# Patient Record
Sex: Male | Born: 1980
Health system: Southern US, Community
[De-identification: ages and names within clinical notes are randomized; demographics above are authoritative.]

## PROBLEM LIST (undated history)

## (undated) DIAGNOSIS — I10 Essential (primary) hypertension: Secondary | ICD-10-CM

## (undated) DIAGNOSIS — E7800 Pure hypercholesterolemia, unspecified: Secondary | ICD-10-CM

## (undated) HISTORY — DX: Essential (primary) hypertension: I10

---

## 2012-02-29 DIAGNOSIS — I10 Essential (primary) hypertension: Secondary | ICD-10-CM

## 2012-02-29 HISTORY — DX: Essential (primary) hypertension: I10

## 2013-12-11 ENCOUNTER — Ambulatory Visit: Payer: Self-pay | Admitting: Family Medicine

## 2014-01-10 ENCOUNTER — Encounter: Payer: Self-pay | Admitting: Family Medicine

## 2014-01-10 ENCOUNTER — Ambulatory Visit: Payer: Self-pay | Admitting: Family Medicine

## 2015-08-04 ENCOUNTER — Emergency Department (HOSPITAL_COMMUNITY)
Admission: EM | Admit: 2015-08-04 | Discharge: 2015-08-04 | Disposition: A | Discharge status: Home / Self Care | Payer: Self-pay | Attending: Emergency Medicine | Admitting: Emergency Medicine

## 2015-08-04 ENCOUNTER — Encounter (HOSPITAL_COMMUNITY): Payer: Self-pay

## 2015-08-04 DIAGNOSIS — L299 Pruritus, unspecified: Secondary | ICD-10-CM | POA: Insufficient documentation

## 2015-08-04 DIAGNOSIS — I10 Essential (primary) hypertension: Secondary | ICD-10-CM | POA: Insufficient documentation

## 2015-08-04 DIAGNOSIS — F17210 Nicotine dependence, cigarettes, uncomplicated: Secondary | ICD-10-CM | POA: Insufficient documentation

## 2015-08-04 MED ORDER — PREDNISONE 10 MG (21) PO TBPK: 10.0000 mg | ORAL_TABLET | Freq: Every day | ORAL | Status: DC

## 2015-08-04 MED ORDER — LORATADINE 10 MG PO TABS: 10.0000 mg | ORAL_TABLET | Freq: Every day | ORAL | Status: DC

## 2015-08-04 MED ORDER — HYDROXYZINE HCL 25 MG PO TABS: 25.0000 mg | ORAL_TABLET | Freq: Four times a day (QID) | ORAL | Status: DC

## 2015-08-04 NOTE — ED Notes (Signed)
Patient complains of intermittent itching and a small hive that pops up on extremities and groin that resolves without intervention, denies pain. No distress

## 2015-08-04 NOTE — Discharge Instructions (Signed)
You have been seen today for itching, possibly from allergies. Prednisone is a steroid and is used to reduce inflammation and swelling. Take it as directed in its entirety. Hydroxyzine is an anti-itch medication. Claritin is an antihistamine or allergy medication that should be taken daily. Follow up with PCP as needed should symptoms continue. Return to ED should symptoms worsen or you begin to have fever, vomiting, abdominal pain, difficulty urinating, or any other major concerns.

## 2015-08-04 NOTE — ED Notes (Addendum)
ERROR IN CHARTING 

## 2015-08-04 NOTE — ED Provider Notes (Signed)
CSN: [id number]     Arrival date & time 08/04/15  5621 History  By signing my name below, I, Essence Howell and Alyssa Grove, attest that this documentation has been prepared under the direction and in the presence of Shawn Joy, PA-C.  Electronically Signed: Charline Bills, ED Scribe 08/04/2015 at 2:37 PM.    Chief Complaint  Patient presents with  . Pruritis     The history is provided by the patient. No language interpreter was used.   HPI Comments: Thomas Mclean. is a 35 y.o. male who presents to the Emergency Department complaining of intermittent, allergic reaction of hives onset of last week on the left thigh, right posterior, and genital area. Pt reports having allergies to grass and dust. He reports having similar symptoms throughout his life. Pt has not used anything to relieve rash. Pt denies exposure to wood vine or poison ivy, new soaps/detergents, or any other new substance. Patient denies fevers/chills, abdominal pain, back pain, nausea/vomiting, difficulty urinating, or any other complaints.   Past Medical History  Diagnosis Date  . Hypertension 2014   History reviewed. No pertinent past surgical history. Family History  Problem Relation Age of Onset  . Cancer Father   . Hypertension Maternal Grandmother   . Hypertension Maternal Grandfather    Social History  Substance Use Topics  . Smoking status: Current Every Day Smoker    Types: Cigarettes    Start date: 02/28/1993  . Smokeless tobacco: Never Used  . Alcohol Use: No    Review of Systems  Constitutional: Negative for fever and chills.  Gastrointestinal: Negative for nausea, vomiting and abdominal pain.  Genitourinary: Negative for dysuria, hematuria, flank pain and penile pain.  Musculoskeletal: Negative for back pain.  Skin: Positive for rash.  All other systems reviewed and are negative.   Allergies  Review of patient's allergies indicates no known allergies.  Home Medications   Prior to  Admission medications   Medication Sig Start Date End Date Taking? Authorizing Provider  hydrOXYzine (ATARAX/VISTARIL) 25 MG tablet Take 1 tablet (25 mg total) by mouth every 6 (six) hours. 08/04/15   Shawn C Joy, PA-C  loratadine (CLARITIN) 10 MG tablet Take 1 tablet (10 mg total) by mouth daily. 08/04/15   Shawn C Joy, PA-C  predniSONE (STERAPRED UNI-PAK 21 TAB) 10 MG (21) TBPK tablet Take 1 tablet (10 mg total) by mouth daily. Take 6 tabs by mouth daily  for 2 days, then 5 tabs for 2 days, then 4 tabs for 2 days, then 3 tabs for 2 days, 2 tabs for 2 days, then 1 tab by mouth daily for 2 days 08/04/15   Shawn C Joy, PA-C   BP 140/84 mmHg  Pulse 68  Temp(Src) 98.3 F (36.8 C)  Resp 18  SpO2 100% Physical Exam  Constitutional: He appears well-developed and well-nourished. No distress.  HENT:  Head: Normocephalic and atraumatic.  Eyes: Conjunctivae are normal.  Neck: Neck supple.  Cardiovascular: Normal rate, regular rhythm and intact distal pulses.   Pulmonary/Chest: Effort normal. No respiratory distress.  Abdominal: Soft. There is no tenderness. There is no guarding.  Genitourinary:  Edema in the foreskin of penis and skin of the scrotum. No discernable lesions, tenderness, or penile discharge. Scribe, Daphine Deutscher, served as Biomedical engineer during the exam.  Musculoskeletal: Normal range of motion. He exhibits no edema or tenderness.  Neurological: He is alert.  Skin: Skin is warm and dry. He is not diaphoretic.  No discernible lesions in the  areas patient indicates for his pruritus.  Psychiatric: He has a normal mood and affect. His behavior is normal.  Nursing note and vitals reviewed.   ED Course  Procedures (including critical care time) DIAGNOSTIC STUDIES: Oxygen Saturation is 100% on RA, normal by my interpretation.    COORDINATION OF CARE: 11:28 AM-Discussed treatment plan which includes Prednisone, Hydroxyzine, and Ioratadine with pt at bedside and pt agreed to plan.     MDM    Final diagnoses:  Pruritus    Thomas HornKenneth Ray [facility] presents with itching in multiple areas of his body, intermittent over the last week.  Patient has known objective symptoms other than some edema into the skin of the penis and scrotum. This, combined with the complaint of itching, could be from allergic reaction or some other source. Patient given antihistamines and prednisone. Patient to follow up with PCP should symptoms continue. Return precautions discussed. Patient voiced understanding of these instructions and is comfortable with discharge.  I personally performed the services described in this documentation, which was scribed in my presence. The recorded information has been reviewed and is accurate.  Anselm PancoastShawn C Joy, PA-C 08/04/15 1437  Glynn OctaveStephen Rancour, MD 08/04/15 [phone]

## 2016-12-09 ENCOUNTER — Ambulatory Visit (INDEPENDENT_AMBULATORY_CARE_PROVIDER_SITE_OTHER): Payer: Self-pay | Admitting: Physician Assistant

## 2016-12-09 ENCOUNTER — Encounter (INDEPENDENT_AMBULATORY_CARE_PROVIDER_SITE_OTHER): Payer: Self-pay | Admitting: Physician Assistant

## 2016-12-09 ENCOUNTER — Other Ambulatory Visit (HOSPITAL_COMMUNITY)
Admission: RE | Admit: 2016-12-09 | Discharge: 2016-12-09 | Disposition: A | Discharge status: Home / Self Care | Payer: Self-pay | Source: Ambulatory Visit | Attending: Physician Assistant | Admitting: Physician Assistant

## 2016-12-09 VITALS — BP 157/90 | HR 66 | Temp 98.2°F | Wt 198.4 lb

## 2016-12-09 DIAGNOSIS — J069 Acute upper respiratory infection, unspecified: Secondary | ICD-10-CM

## 2016-12-09 DIAGNOSIS — I10 Essential (primary) hypertension: Secondary | ICD-10-CM

## 2016-12-09 DIAGNOSIS — Z202 Contact with and (suspected) exposure to infections with a predominantly sexual mode of transmission: Secondary | ICD-10-CM

## 2016-12-09 DIAGNOSIS — Z131 Encounter for screening for diabetes mellitus: Secondary | ICD-10-CM

## 2016-12-09 DIAGNOSIS — R7303 Prediabetes: Secondary | ICD-10-CM

## 2016-12-09 LAB — POCT GLYCOSYLATED HEMOGLOBIN (HGB A1C): HEMOGLOBIN A1C: 5.7

## 2016-12-09 MED ORDER — METRONIDAZOLE 500 MG PO TABS: 500.0000 mg | ORAL_TABLET | Freq: Two times a day (BID) | ORAL | 0 refills | Status: AC

## 2016-12-09 MED ORDER — GUAIFENESIN ER 1200 MG PO TB12: 1.0000 | ORAL_TABLET | Freq: Two times a day (BID) | ORAL | 0 refills | Status: AC

## 2016-12-09 MED ORDER — HYDROCHLOROTHIAZIDE 25 MG PO TABS: 25.0000 mg | ORAL_TABLET | Freq: Every day | ORAL | 1 refills | Status: DC

## 2016-12-09 NOTE — Progress Notes (Signed)
Subjective:  Patient ID: Thomas Likes., male    DOB: Jul 06, 1980  Age: 36 y.o. MRN: [id number]  CC: URI, STD?  HPI Thomas Rawlinson Hatillo. is a 36 y.o. male with a medical history of HTN presents as a new patient with concern for URI and STD. Says girlfriend was diagnosed with trichomonas. Does not endorse penile discharge, genital lesions, testicular pain, testicular swelling, dysuria, urinary frequency, or hematuria.    Also complains of cold and nasal congestion. Onset approximately 1-2 weeks ago. No longer with aches, fever, chills, or fatigue. However, there is still mucus drainage. Does not currently take any cold remedies.     Outpatient Medications Prior to Visit  Medication Sig Dispense Refill  . hydrOXYzine (ATARAX/VISTARIL) 25 MG tablet Take 1 tablet (25 mg total) by mouth every 6 (six) hours. (Patient not taking: Reported on 12/09/2016) 12 tablet 0  . loratadine (CLARITIN) 10 MG tablet Take 1 tablet (10 mg total) by mouth daily. (Patient not taking: Reported on 12/09/2016) 30 tablet 2  . predniSONE (STERAPRED UNI-PAK 21 TAB) 10 MG (21) TBPK tablet Take 1 tablet (10 mg total) by mouth daily. Take 6 tabs by mouth daily  for 2 days, then 5 tabs for 2 days, then 4 tabs for 2 days, then 3 tabs for 2 days, 2 tabs for 2 days, then 1 tab by mouth daily for 2 days (Patient not taking: Reported on 12/09/2016) 42 tablet 0   No facility-administered medications prior to visit.      ROS Review of Systems  Constitutional: Negative for chills, fever and malaise/fatigue.  HENT: Positive for congestion.   Eyes: Negative for blurred vision.  Respiratory: Negative for shortness of breath.   Cardiovascular: Negative for chest pain and palpitations.  Gastrointestinal: Negative for abdominal pain and nausea.  Genitourinary: Negative for dysuria and hematuria.  Musculoskeletal: Negative for joint pain and myalgias.  Skin: Negative for rash.  Neurological: Negative for tingling and  headaches.  Psychiatric/Behavioral: Negative for depression. The patient is not nervous/anxious.     Objective:  Wt 198 lb 6.4 oz (90 kg)   BP/Weight 12/09/2016 08/04/2015  Systolic BP - 140  Diastolic BP - 84  Wt. (Lbs) 198.4 -      Physical Exam  Constitutional: He is oriented to person, place, and time.  Well developed, well nourished, NAD, polite  HENT:  Head: Normocephalic and atraumatic.  Normal oropharynx  Eyes: No scleral icterus.  Neck: Normal range of motion. Neck supple. No thyromegaly present.  Cardiovascular: Normal rate, regular rhythm and normal heart sounds.   Pulmonary/Chest: Effort normal and breath sounds normal.  Musculoskeletal: He exhibits no edema.  Neurological: He is alert and oriented to person, place, and time. No cranial nerve deficit. Coordination normal.  Skin: Skin is warm and dry. No rash noted. No erythema. No pallor.  Psychiatric: He has a normal mood and affect. His behavior is normal. Thought content normal.  Vitals reviewed.    Assessment & Plan:   1. Hypertension, unspecified type - Begin HCTZ  qday #90 one refill  2. STD exposure - HIV antibody - Urine cytology ancillary only - HSV(herpes simplex vrs) 1+2 ab-IgG - RPR - metroNIDAZOLE (FLAGYL) 500 MG tablet; Take 1 tablet (500 mg total) by mouth 2 (two) times daily.  Dispense: 14 tablet; Refill: 0  3. Acute upper respiratory infection - Guaifenesin (MUCINEX MAXIMUM STRENGTH) 1200 MG TB12; Take 1 tablet (1,200 mg total) by mouth 2 (two) times daily.  Dispense:  10 tablet; Refill: 0  4. Screening for diabetes mellitus - HgB A1c 5.7% in clinic today  5. Prediabetes - A1c 5.7% in clinic today - Printed prediabetes eating plan   Meds ordered this encounter  Medications  . metroNIDAZOLE (FLAGYL) 500 MG tablet    Sig: Take 1 tablet (500 mg total) by mouth 2 (two) times daily.    Dispense:  14 tablet    Refill:  0    Order Specific Question:   Supervising Provider     Answer:   Quentin Angst [id number]  . Guaifenesin (MUCINEX MAXIMUM STRENGTH) 1200 MG TB12    Sig: Take 1 tablet (1,200 mg total) by mouth 2 (two) times daily.    Dispense:  10 tablet    Refill:  0    Order Specific Question:   Supervising Provider    Answer:   Quentin Angst [id number]    Follow-up: Return in about 6 months (around 06/09/2017) for prediabetes.   Loletta Specter PA

## 2016-12-09 NOTE — Patient Instructions (Addendum)
Trichomoniasis Trichomoniasis is an STI (sexually transmitted infection) that can affect both women and men. In women, the outer area of the male genitalia (vulva) and the vagina are affected. In men, the penis is mainly affected, but the prostate and other reproductive organs can also be involved. This condition can be treated with medicine. It often has no symptoms (is asymptomatic), especially in men. What are the causes? This condition is caused by an organism called Trichomonas vaginalis. Trichomoniasis most often spreads from person to person (is contagious) through sexual contact. What increases the risk? The following factors may make you more likely to develop this condition:  Having unprotected sexual intercourse.  Having sexual intercourse with a partner who has trichomoniasis.  Having multiple sexual partners.  Having had previous trichomoniasis infections or other STIs.  What are the signs or symptoms? In women, symptoms of trichomoniasis include:  Abnormal vaginal discharge that is clear, white, gray, or yellow-green and foamy and has an unusual "fishy" odor.  Itching and irritation of the vagina and vulva.  Burning or pain during urination or sexual intercourse.  Genital redness and swelling.  In men, symptoms of trichomoniasis include:  Penile discharge that may be foamy or contain pus.  Pain in the penis. This may happen only when urinating.  Itching or irritation inside the penis.  Burning after urination or ejaculation.  How is this diagnosed? In women, this condition may be found during a routine Pap test or physical exam. It may be found in men during a routine physical exam. Your health care provider may perform tests to help diagnose this infection, such as:  Urine tests (men and women).  The following in women: ? Testing the pH of the vagina. ? A vaginal swab test that checks for the Trichomonas vaginalis organism. ? Testing vaginal  secretions.  Your health care provider may test you for other STIs, including HIV (human immunodeficiency virus). How is this treated? This condition is treated with medicine taken by mouth (orally), such as metronidazole or tinidazole to fight the infection. Your sexual partner(s) may also need to be tested and treated.  If you are a woman and you plan to become pregnant or think you may be pregnant, tell your health care provider right away. Some medicines that are used to treat the infection should not be taken during pregnancy.  Your health care provider may recommend over-the-counter medicines or creams to help relieve itching or irritation. You may be tested for infection again 3 months after treatment. Follow these instructions at home:  Take and use over-the-counter and prescription medicines, including creams, only as told by your health care provider.  Do not have sexual intercourse until one week after you finish your medicine, or until your health care provider approves. Ask your health care provider when you may resume sexual intercourse.  (Women) Do not douche or wear tampons while you have the infection.  Discuss your infection with your sexual partner(s). Make sure that your partner gets tested and treated, if necessary.  Keep all follow-up visits as told by your health care provider. This is important. How is this prevented?  Use condoms every time you have sex. Using condoms correctly and consistently can help protect against STIs.  Avoid having multiple sexual partners.  Talk with your sexual partner about any symptoms that either of you may have, as well as any history of STIs.  Get tested for STIs and STDs (sexually transmitted diseases) before you have sex. Ask your partner  to do the same.  Do not have sexual contact if you have symptoms of trichomoniasis or another STI. Contact a health care provider if:  You still have symptoms after you finish your  medicine.  You develop pain in your abdomen.  You have pain when you urinate.  You have bleeding after sexual intercourse.  You develop a rash.  You feel nauseous or you vomit.  You plan to become pregnant or think you may be pregnant. Summary  Trichomoniasis is an STI (sexually transmitted infection) that can affect both women and men.  This condition often has no symptoms (is asymptomatic), especially in men.  You should not have sexual intercourse until one week after you finish your medicine, or until your health care provider approves. Ask your health care provider when you may resume sexual intercourse.  Discuss your infection with your sexual partner. Make sure that your partner gets tested and treated, if necessary. This information is not intended to replace advice given to you by your health care provider. Make sure you discuss any questions you have with your health care provider. Document Released: 08/10/2000 Document Revised: 01/08/2016 Document Reviewed: 01/08/2016 Elsevier Interactive Patient Education  2017 [employer] Upper Respiratory Infection, Adult Most upper respiratory infections (URIs) are a viral infection of the air passages leading to the lungs. A URI affects the nose, throat, and upper air passages. The most common type of URI is nasopharyngitis and is typically referred to as "the common cold." URIs run their course and usually go away on their own. Most of the time, a URI does not require medical attention, but sometimes a bacterial infection in the upper airways can follow a viral infection. This is called a secondary infection. Sinus and middle ear infections are common types of secondary upper respiratory infections. Bacterial pneumonia can also complicate a URI. A URI can worsen asthma and chronic obstructive pulmonary disease (COPD). Sometimes, these complications can require emergency medical care and may be life threatening. What are the  causes? Almost all URIs are caused by viruses. A virus is a type of germ and can spread from one person to another. What increases the risk? You may be at risk for a URI if:  You smoke.  You have chronic heart or lung disease.  You have a weakened defense (immune) system.  You are very young or very old.  You have nasal allergies or asthma.  You work in crowded or poorly ventilated areas.  You work in health care facilities or schools.  What are the signs or symptoms? Symptoms typically develop 2-3 days after you come in contact with a cold virus. Most viral URIs last 7-10 days. However, viral URIs from the influenza virus (flu virus) can last 14-18 days and are typically more severe. Symptoms may include:  Runny or stuffy (congested) nose.  Sneezing.  Cough.  Sore throat.  Headache.  Fatigue.  Fever.  Loss of appetite.  Pain in your forehead, behind your eyes, and over your cheekbones (sinus pain).  Muscle aches.  How is this diagnosed? Your health care provider may diagnose a URI by:  Physical exam.  Tests to check that your symptoms are not due to another condition such as: ? Strep throat. ? Sinusitis. ? Pneumonia. ? Asthma.  How is this treated? A URI goes away on its own with time. It cannot be cured with medicines, but medicines may be prescribed or recommended to relieve symptoms. Medicines may help:  Reduce your fever.  Reduce your cough.  Relieve nasal congestion.  Follow these instructions at home:  Take medicines only as directed by your health care provider.  Gargle warm saltwater or take cough drops to comfort your throat as directed by your health care provider.  Use a warm mist humidifier or inhale steam from a shower to increase air moisture. This may make it easier to breathe.  Drink enough fluid to keep your urine clear or pale yellow.  Eat soups and other clear broths and maintain good nutrition.  Rest as needed.  Return  to work when your temperature has returned to normal or as your health care provider advises. You may need to stay home longer to avoid infecting others. You can also use a face mask and careful hand washing to prevent spread of the virus.  Increase the usage of your inhaler if you have asthma.  Do not use any tobacco products, including cigarettes, chewing tobacco, or electronic cigarettes. If you need help quitting, ask your health care provider. How is this prevented? The best way to protect yourself from getting a cold is to practice good hygiene.  Avoid oral or hand contact with people with cold symptoms.  Wash your hands often if contact occurs.  There is no clear evidence that vitamin C, vitamin E, echinacea, or exercise reduces the chance of developing a cold. However, it is always recommended to get plenty of rest, exercise, and practice good nutrition. Contact a health care provider if:  You are getting worse rather than better.  Your symptoms are not controlled by medicine.  You have chills.  You have worsening shortness of breath.  You have brown or red mucus.  You have yellow or brown nasal discharge.  You have pain in your face, especially when you bend forward.  You have a fever.  You have swollen neck glands.  You have pain while swallowing.  You have white areas in the back of your throat. Get help right away if:  You have severe or persistent: ? Headache. ? Ear pain. ? Sinus pain. ? Chest pain.  You have chronic lung disease and any of the following: ? Wheezing. ? Prolonged cough. ? Coughing up blood. ? A change in your usual mucus.  You have a stiff neck.  You have changes in your: ? Vision. ? Hearing. ? Thinking. ? Mood. This information is not intended to replace advice given to you by your health care provider. Make sure you discuss any questions you have with your health care provider. Document Released: 08/10/2000 Document Revised:  10/18/2015 Document Reviewed: 05/22/2013 Elsevier Interactive Patient Education  2017 Else[employer] Inc[employer]    Prediabetes Eating Plan Prediabetes-also called impaired glucose tolerance or impaired fasting glucose-is a condition that causes blood sugar (blood glucose) levels to be higher than normal. Following a healthy diet can help to keep prediabetes under control. It can also help to lower the risk of type 2 diabetes and heart disease, which are increased in people who have prediabetes. Along with regular exercise, a healthy diet:  Promotes weight loss.  Helps to control blood sugar levels.  Helps to improve the way that the body uses insulin.  What do I need to know about this eating plan?  Use the glycemic index (GI) to plan your meals. The index tells you how quickly a food will raise your blood sugar. Choose low-GI foods. These foods take a longer time to raise blood sugar.  Pay close attention to the amount of  carbohydrates in the food that you eat. Carbohydrates increase blood sugar levels.  Keep track of how many calories you take in. Eating the right amount of calories will help you to achieve a healthy weight. Losing about 7 percent of your starting weight can help to prevent type 2 diabetes.  You may want to follow a Mediterranean diet. This diet includes a lot of vegetables, lean meats or fish, whole grains, fruits, and healthy oils and fats. What foods can I eat? Grains Whole grains, such as whole-wheat or whole-grain breads, crackers, cereals, and pasta. Unsweetened oatmeal. Bulgur. Barley. Quinoa. Brown rice. Corn or whole-wheat flour tortillas or taco shells. Vegetables Lettuce. Spinach. Peas. Beets. Cauliflower. Cabbage. Broccoli. Carrots. Tomatoes. Squash. Eggplant. Herbs. Peppers. Onions. Cucumbers. Brussels sprouts. Fruits Berries. Bananas. Apples. Oranges. Grapes. Papaya. Mango. Pomegranate. Kiwi. Grapefruit. Cherries. Meats and Other Protein Sources Seafood. Lean  meats, such as chicken and [place] or lean cuts of pork and beef. Tofu. Eggs. Nuts. Beans. Dairy Low-fat or fat-free dairy products, such as yogurt, cottage cheese, and cheese. Beverages Water. Tea. Coffee. Sugar-free or diet soda. Seltzer water. Milk. Milk alternatives, such as soy or almond milk. Condiments Mustard. Relish. Low-fat, low-sugar ketchup. Low-fat, low-sugar barbecue sauce. Low-fat or fat-free mayonnaise. Sweets and Desserts Sugar-free or low-fat pudding. Sugar-free or low-fat ice cream and other frozen treats. Fats and Oils Avocado. Walnuts. Olive oil. The items listed above may not be a complete list of recommended foods or beverages. Contact your dietitian for more options. What foods are not recommended? Grains Refined white flour and flour products, such as bread, pasta, snack foods, and cereals. Beverages Sweetened drinks, such as sweet iced tea and soda. Sweets and Desserts Baked goods, such as cake, cupcakes, pastries, cookies, and cheesecake. The items listed above may not be a complete list of foods and beverages to avoid. Contact your dietitian for more information. This information is not intended to replace advice given to you by your health care provider. Make sure you discuss any questions you have with your health care provider. Document Released: 07/01/2014 Document Revised: 07/23/2015 Document Reviewed: 03/12/2014 Elsevier Interactive Patient Education  2017 [employer].

## 2016-12-12 LAB — URINE CYTOLOGY ANCILLARY ONLY
Chlamydia: NEGATIVE
Neisseria Gonorrhea: NEGATIVE
Trichomonas: NEGATIVE

## 2016-12-12 LAB — SYPHILIS: RPR W/REFLEX TO RPR TITER AND TREPONEMAL ANTIBODIES, TRADITIONAL SCREENING AND DIAGNOSIS ALGORITHM: RPR Ser Ql: NONREACTIVE

## 2016-12-12 LAB — HSV(HERPES SIMPLEX VRS) I + II AB-IGG
HSV 1 Glycoprotein G Ab, IgG: 7.88 index — ABNORMAL HIGH (ref 0.00–0.90)
HSV 2 IgG, Type Spec: <0.91 index (ref 0.00–0.90)

## 2016-12-12 LAB — HIV ANTIBODY (ROUTINE TESTING W REFLEX): HIV Screen 4th Generation wRfx: NONREACTIVE

## 2016-12-13 ENCOUNTER — Telehealth: Payer: Self-pay

## 2016-12-13 NOTE — Telephone Encounter (Signed)
Patient aware of lab results. Tempestt S Roberts, CMA  

## 2016-12-13 NOTE — Telephone Encounter (Signed)
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 Negative for HSV 2 (genital usually), syphillis, chlamydia, gonorrhea, HIV, and trichomonas.

## 2017-04-13 ENCOUNTER — Encounter (INDEPENDENT_AMBULATORY_CARE_PROVIDER_SITE_OTHER): Payer: Self-pay | Admitting: Physician Assistant

## 2017-04-13 ENCOUNTER — Ambulatory Visit (INDEPENDENT_AMBULATORY_CARE_PROVIDER_SITE_OTHER): Payer: Self-pay | Admitting: Physician Assistant

## 2017-04-13 ENCOUNTER — Telehealth (INDEPENDENT_AMBULATORY_CARE_PROVIDER_SITE_OTHER): Payer: Self-pay | Admitting: Physician Assistant

## 2017-04-13 VITALS — BP 165/103 | HR 78 | Temp 98.2°F | Resp 18 | Ht 72.0 in | Wt 186.0 lb

## 2017-04-13 DIAGNOSIS — I10 Essential (primary) hypertension: Secondary | ICD-10-CM

## 2017-04-13 MED ORDER — ASPIRIN 81 MG PO TBEC: 81.0000 mg | DELAYED_RELEASE_TABLET | Freq: Every day | ORAL | 3 refills | Status: DC

## 2017-04-13 MED ORDER — HYDROCHLOROTHIAZIDE 25 MG PO TABS: 25.0000 mg | ORAL_TABLET | Freq: Every day | ORAL | 1 refills | Status: DC

## 2017-04-13 MED ORDER — AMLODIPINE BESYLATE 10 MG PO TABS
10.0000 mg | ORAL_TABLET | Freq: Every day | ORAL | 1 refills | Status: DC
Start: 2017-04-13 — End: 2017-05-10

## 2017-04-13 NOTE — Progress Notes (Signed)
Subjective:  Patient ID: Thomas LikesKenneth Ray Calles Jr., male    DOB: 03/28/1980  Age: 37 y.o. MRN: [id number]  CC: HTN  HPI   Thomas LikesKenneth Ray Kratt Jr. is a 37 y.o. male with a medical history of HTN and prediabetes  presents on f/u for HTN. Last BP 157/90 mmHg on 12/09/16. Prescribed HCTZ 25 mg which he takes daily. Did not take this morning. BP is now 165/103. Has taken BP at outside BP monitors and reports 200s/100s.  Has rare substernal pain that last 2 minutes and characterized as sharp. Occasional nausea with vomiting. Not related to food/liquid consumption. Rare SOB. Occasional headache. No radiation of chest pain. Does not endorse palpitations, abdominal pain, f/c/n/v, rash, or GU sxs.        Outpatient Medications Prior to Visit  Medication Sig Dispense Refill  . hydrochlorothiazide (HYDRODIURIL) 25 MG tablet Take 1 tablet (25 mg total) by mouth daily. Take on tablet in the morning. 90 tablet 1   No facility-administered medications prior to visit.      ROS Review of Systems  Constitutional: Negative for chills, fever and malaise/fatigue.  Eyes: Negative for blurred vision.  Respiratory: Negative for shortness of breath.   Cardiovascular: Positive for chest pain. Negative for palpitations.  Gastrointestinal: Positive for nausea and vomiting. Negative for abdominal pain.  Genitourinary: Negative for dysuria and hematuria.  Musculoskeletal: Negative for joint pain and myalgias.  Skin: Negative for rash.  Neurological: Negative for tingling and headaches.  Psychiatric/Behavioral: Negative for depression. The patient is not nervous/anxious.     Objective:  BP (!) 165/103 (BP Location: Right Arm, Patient Position: Sitting, Cuff Size: Large)   Pulse 78   Temp 98.2 F (36.8 C) (Oral)   Resp 18   Ht 6' (1.829 m)   Wt 186 lb (84.4 kg)   SpO2 97%   BMI 25.23 kg/m   BP/Weight 04/13/2017 12/09/2016 08/04/2015  Systolic BP 165 157 140  Diastolic BP 103 90 84  Wt. (Lbs) 186 198.4 -   BMI 25.23 - -      Physical Exam  Constitutional: He is oriented to person, place, and time.  Well developed, well nourished, NAD, polite  HENT:  Head: Normocephalic and atraumatic.  Eyes: No scleral icterus.  Neck: Normal range of motion. Neck supple. No thyromegaly present.  Cardiovascular: Normal rate, regular rhythm and normal heart sounds.  Pulmonary/Chest: Effort normal and breath sounds normal.  Abdominal: Soft. Bowel sounds are normal. There is no tenderness.  Musculoskeletal: He exhibits no edema.  Neurological: He is alert and oriented to person, place, and time.  Skin: Skin is warm and dry. No rash noted. No erythema. No pallor.  Psychiatric: He has a normal mood and affect. His behavior is normal. Thought content normal.  Vitals reviewed.    Assessment & Plan:    1. Hypertension, unspecified type - Comprehensive metabolic panel - CBC with Differential - TSH - Lipid panel - Sedimentation Rate - Begin aspirin 81 MG EC tablet; Take 1 tablet (81 mg total) by mouth daily. Swallow whole.  Dispense: 90 tablet; Refill: 3 - Begin amLODipine (NORVASC) 10 MG tablet; Take 1 tablet (10 mg total) by mouth daily.  Dispense: 90 tablet; Refill: 1 - Refill HCTZ 25 mg #90 one refill  Meds ordered this encounter  Medications  . aspirin 81 MG EC tablet    Sig: Take 1 tablet (81 mg total) by mouth daily. Swallow whole.    Dispense:  90 tablet  Refill:  3    Order Specific Question:   Supervising Provider    Answer:   Quentin Angst [id number]  . amLODipine (NORVASC) 10 MG tablet    Sig: Take 1 tablet (10 mg total) by mouth daily.    Dispense:  90 tablet    Refill:  1    Order Specific Question:   Supervising Provider    Answer:   Quentin Angst [id number]    Follow-up: Return in about 4 weeks (around 05/11/2017) for HTN.   Loletta Specter PA

## 2017-04-13 NOTE — Patient Instructions (Signed)
Managing Your Hypertension Hypertension is commonly called high blood pressure. This is when the force of your blood pressing against the walls of your arteries is too strong. Arteries are blood vessels that carry blood from your heart throughout your body. Hypertension forces the heart to work harder to pump blood, and may cause the arteries to become narrow or stiff. Having untreated or uncontrolled hypertension can cause heart attack, stroke, kidney disease, and other problems. What are blood pressure readings? A blood pressure reading consists of a higher number over a lower number. Ideally, your blood pressure should be below 120/80. The first ("top") number is called the systolic pressure. It is a measure of the pressure in your arteries as your heart beats. The second ("bottom") number is called the diastolic pressure. It is a measure of the pressure in your arteries as the heart relaxes. What does my blood pressure reading mean? Blood pressure is classified into four stages. Based on your blood pressure reading, your health care provider may use the following stages to determine what type of treatment you need, if any. Systolic pressure and diastolic pressure are measured in a unit called mm Hg. Normal  Systolic pressure: below 120.  Diastolic pressure: below 80. Elevated  Systolic pressure: 120-129.  Diastolic pressure: below 80. Hypertension stage 1  Systolic pressure: 130-139.  Diastolic pressure: 80-89. Hypertension stage 2  Systolic pressure: 140 or above.  Diastolic pressure: 90 or above. What health risks are associated with hypertension? Managing your hypertension is an important responsibility. Uncontrolled hypertension can lead to:  A heart attack.  A stroke.  A weakened blood vessel (aneurysm).  Heart failure.  Kidney damage.  Eye damage.  Metabolic syndrome.  Memory and concentration problems.  What changes can I make to manage my  hypertension? Hypertension can be managed by making lifestyle changes and possibly by taking medicines. Your health care provider will help you make a plan to bring your blood pressure within a normal range. Eating and drinking  Eat a diet that is high in fiber and potassium, and low in salt (sodium), added sugar, and fat. An example eating plan is called the DASH (Dietary Approaches to Stop Hypertension) diet. To eat this way: ? Eat plenty of fresh fruits and vegetables. Try to fill half of your plate at each meal with fruits and vegetables. ? Eat whole grains, such as whole wheat pasta, brown rice, or whole grain bread. Fill about one quarter of your plate with whole grains. ? Eat low-fat diary products. ? Avoid fatty cuts of meat, processed or cured meats, and poultry with skin. Fill about one quarter of your plate with lean proteins such as fish, chicken without skin, beans, eggs, and tofu. ? Avoid premade and processed foods. These tend to be higher in sodium, added sugar, and fat.  Reduce your daily sodium intake. Most people with hypertension should eat less than 1,500 mg of sodium a day.  Limit alcohol intake to no more than 1 drink a day for nonpregnant women and 2 drinks a day for men. One drink equals 12 oz of beer, 5 oz of wine, or 1 oz of hard liquor. Lifestyle  Work with your health care provider to maintain a healthy body weight, or to lose weight. Ask what an ideal weight is for you.  Get at least 30 minutes of exercise that causes your heart to beat faster (aerobic exercise) most days of the week. Activities may include walking, swimming, or biking.  Include exercise   to strengthen your muscles (resistance exercise), such as weight lifting, as part of your weekly exercise routine. Try to do these types of exercises for 30 minutes at least 3 days a week.  Do not use any products that contain nicotine or tobacco, such as cigarettes and e-cigarettes. If you need help quitting, ask  your health care provider.  Control any long-term (chronic) conditions you have, such as high cholesterol or diabetes. Monitoring  Monitor your blood pressure at home as told by your health care provider. Your personal target blood pressure may vary depending on your medical conditions, your age, and other factors.  Have your blood pressure checked regularly, as often as told by your health care provider. Working with your health care provider  Review all the medicines you take with your health care provider because there may be side effects or interactions.  Talk with your health care provider about your diet, exercise habits, and other lifestyle factors that may be contributing to hypertension.  Visit your health care provider regularly. Your health care provider can help you create and adjust your plan for managing hypertension. Will I need medicine to control my blood pressure? Your health care provider may prescribe medicine if lifestyle changes are not enough to get your blood pressure under control, and if:  Your systolic blood pressure is 130 or higher.  Your diastolic blood pressure is 80 or higher.  Take medicines only as told by your health care provider. Follow the directions carefully. Blood pressure medicines must be taken as prescribed. The medicine does not work as well when you skip doses. Skipping doses also puts you at risk for problems. Contact a health care provider if:  You think you are having a reaction to medicines you have taken.  You have repeated (recurrent) headaches.  You feel dizzy.  You have swelling in your ankles.  You have trouble with your vision. Get help right away if:  You develop a severe headache or confusion.  You have unusual weakness or numbness, or you feel faint.  You have severe pain in your chest or abdomen.  You vomit repeatedly.  You have trouble breathing. Summary  Hypertension is when the force of blood pumping through  your arteries is too strong. If this condition is not controlled, it may put you at risk for serious complications.  Your personal target blood pressure may vary depending on your medical conditions, your age, and other factors. For most people, a normal blood pressure is less than 120/80.  Hypertension is managed by lifestyle changes, medicines, or both. Lifestyle changes include weight loss, eating a healthy, low-sodium diet, exercising more, and limiting alcohol. This information is not intended to replace advice given to you by your health care provider. Make sure you discuss any questions you have with your health care provider. Document Released: 11/09/2011 Document Revised: 01/13/2016 Document Reviewed: 01/13/2016 Elsevier Interactive Patient Education  2018 [employer]  

## 2017-04-14 ENCOUNTER — Other Ambulatory Visit (INDEPENDENT_AMBULATORY_CARE_PROVIDER_SITE_OTHER): Payer: Self-pay | Admitting: Physician Assistant

## 2017-04-14 DIAGNOSIS — E7841 Elevated Lipoprotein(a): Secondary | ICD-10-CM

## 2017-04-14 LAB — COMPREHENSIVE METABOLIC PANEL
ALK PHOS: 75 IU/L (ref 39–117)
ALT: 23 IU/L (ref 0–44)
AST: 25 IU/L (ref 0–40)
Albumin/Globulin Ratio: 1.6 (ref 1.2–2.2)
Albumin: 4.9 g/dL (ref 3.5–5.5)
BUN/Creatinine Ratio: 12 (ref 9–20)
BUN: 13 mg/dL (ref 6–20)
Bilirubin Total: 0.4 mg/dL (ref 0.0–1.2)
CALCIUM: 9.9 mg/dL (ref 8.7–10.2)
CO2: 16 mmol/L — AB (ref 20–29)
CREATININE: 1.11 mg/dL (ref 0.76–1.27)
Chloride: 96 mmol/L (ref 96–106)
GFR calc Af Amer: 97 mL/min/{1.73_m2} (ref >59)
GFR, EST NON AFRICAN AMERICAN: 84 mL/min/{1.73_m2} (ref >59)
GLUCOSE: 95 mg/dL (ref 65–99)
Globulin, Total: 3.1 g/dL (ref 1.5–4.5)
Potassium: 3.7 mmol/L (ref 3.5–5.2)
SODIUM: 139 mmol/L (ref 134–144)
Total Protein: 8 g/dL (ref 6.0–8.5)

## 2017-04-14 LAB — TSH: TSH: 3.87 u[IU]/mL (ref 0.450–4.500)

## 2017-04-14 LAB — CBC WITH DIFFERENTIAL/PLATELET
Basophils Absolute: 0 10*3/uL (ref 0.0–0.2)
Basos: 0 %
EOS (ABSOLUTE): 0.1 10*3/uL (ref 0.0–0.4)
EOS: 1 %
HEMATOCRIT: 51.3 % — AB (ref 37.5–51.0)
Hemoglobin: 17.9 g/dL — ABNORMAL HIGH (ref 13.0–17.7)
IMMATURE GRANS (ABS): 0 10*3/uL (ref 0.0–0.1)
Immature Granulocytes: 1 %
LYMPHS: 42 %
Lymphocytes Absolute: 3.4 10*3/uL — ABNORMAL HIGH (ref 0.7–3.1)
MCH: 31.3 pg (ref 26.6–33.0)
MCHC: 34.9 g/dL (ref 31.5–35.7)
MCV: 90 fL (ref 79–97)
Monocytes Absolute: 0.6 10*3/uL (ref 0.1–0.9)
Monocytes: 7 %
NEUTROS PCT: 49 %
Neutrophils Absolute: 3.9 10*3/uL (ref 1.4–7.0)
PLATELETS: 204 10*3/uL (ref 150–379)
RBC: 5.72 x10E6/uL (ref 4.14–5.80)
RDW: 14.1 % (ref 12.3–15.4)
WBC: 8 10*3/uL (ref 3.4–10.8)

## 2017-04-14 LAB — LIPID PANEL
CHOLESTEROL TOTAL: 241 mg/dL — AB (ref 100–199)
Chol/HDL Ratio: 4.5 ratio (ref 0.0–5.0)
HDL: 53 mg/dL (ref >39)
LDL Calculated: 146 mg/dL — ABNORMAL HIGH (ref 0–99)
Triglycerides: 208 mg/dL — ABNORMAL HIGH (ref 0–149)
VLDL Cholesterol Cal: 42 mg/dL — ABNORMAL HIGH (ref 5–40)

## 2017-04-14 LAB — SEDIMENTATION RATE: Sed Rate: 30 mm/hr — ABNORMAL HIGH (ref 0–15)

## 2017-04-14 MED ORDER — ATORVASTATIN CALCIUM 40 MG PO TABS: 40.0000 mg | ORAL_TABLET | Freq: Every day | ORAL | 3 refills | Status: DC

## 2017-04-14 NOTE — Telephone Encounter (Signed)
Patient verified DOB Patient is aware of blood being acidic and inflammation being present, possibly to dehydration and alcohol consumption. Patient is aware of atorvastatin being sent to [facility] for pickup and a recheck being completed at the next visit.

## 2017-04-14 NOTE — Telephone Encounter (Signed)
-----   Message from Loletta Specteroger David Gomez, PA-C sent at 04/14/2017 10:26 AM EST ----- Blood is too acidic. This may be a result of dehydration and/or alcohol use. Has some inflammation in the body which will need to be worked up. Cholesterol level is elevated. I will send out statin to his [facility] on [address].

## 2017-05-10 ENCOUNTER — Encounter (INDEPENDENT_AMBULATORY_CARE_PROVIDER_SITE_OTHER): Payer: Self-pay | Admitting: Physician Assistant

## 2017-05-10 ENCOUNTER — Other Ambulatory Visit: Payer: Self-pay

## 2017-05-10 ENCOUNTER — Ambulatory Visit (INDEPENDENT_AMBULATORY_CARE_PROVIDER_SITE_OTHER): Payer: Self-pay | Admitting: Physician Assistant

## 2017-05-10 VITALS — BP 137/77 | HR 63 | Temp 98.1°F | Wt 201.8 lb

## 2017-05-10 DIAGNOSIS — I10 Essential (primary) hypertension: Secondary | ICD-10-CM

## 2017-05-10 DIAGNOSIS — E7841 Elevated Lipoprotein(a): Secondary | ICD-10-CM

## 2017-05-10 MED ORDER — AMLODIPINE BESYLATE 10 MG PO TABS
10.0000 mg | ORAL_TABLET | Freq: Every day | ORAL | 3 refills | Status: DC
Start: 2017-05-10 — End: 2017-11-10

## 2017-05-10 MED ORDER — HYDROCHLOROTHIAZIDE 25 MG PO TABS: 25.0000 mg | ORAL_TABLET | Freq: Every day | ORAL | 3 refills | Status: DC

## 2017-05-10 MED ORDER — ATORVASTATIN CALCIUM 40 MG PO TABS: 40.0000 mg | ORAL_TABLET | Freq: Every day | ORAL | 3 refills | Status: DC

## 2017-05-10 MED ORDER — ASPIRIN 81 MG PO TBEC: 81.0000 mg | DELAYED_RELEASE_TABLET | Freq: Every day | ORAL | 3 refills | Status: DC

## 2017-05-10 NOTE — Progress Notes (Signed)
Subjective:  Patient ID: Thomas Mclean., male    DOB: 1980-03-30  Age: 37 y.o. MRN: [id number]  CC: HTN  HPI Thomas Mcleanis a 36 y.o.malewith a medical history of HTN, prediabetes, and tobacco use presents to f/u on HTN. Taking anti-hypertensives as directed. States he feels now that is BP is controlled. Has also reduced the amount of alcohol he is drinking. Still smokes but plans to quit at another time. Does not endorse CP, palpitations, SOB, HA, abdominal pain, f/c/n/v, rash, or GI/GU sxs.    Outpatient Medications Prior to Visit  Medication Sig Dispense Refill  . amLODipine (NORVASC) 10 MG tablet Take 1 tablet (10 mg total) by mouth daily. 90 tablet 1  . aspirin 81 MG EC tablet Take 1 tablet (81 mg total) by mouth daily. Swallow whole. 90 tablet 3  . atorvastatin (LIPITOR) 40 MG tablet Take 1 tablet (40 mg total) by mouth daily. 90 tablet 3  . hydrochlorothiazide (HYDRODIURIL) 25 MG tablet Take 1 tablet (25 mg total) by mouth daily. Take on tablet in the morning. 90 tablet 1   No facility-administered medications prior to visit.      ROS Review of Systems  Constitutional: Negative for chills, fever and malaise/fatigue.  Eyes: Negative for blurred vision.  Respiratory: Negative for shortness of breath.   Cardiovascular: Negative for chest pain and palpitations.  Gastrointestinal: Negative for abdominal pain and nausea.  Genitourinary: Negative for dysuria and hematuria.  Musculoskeletal: Negative for joint pain and myalgias.  Skin: Negative for rash.  Neurological: Negative for tingling and headaches.  Psychiatric/Behavioral: Negative for depression. The patient is not nervous/anxious.     Objective:  BP 137/77 (BP Location: Right Arm, Patient Position: Sitting, Cuff Size: Large)   Pulse 63   Temp 98.1 F (36.7 C) (Oral)   Wt 201 lb 12.8 oz (91.5 kg)   SpO2 99%   BMI 27.37 kg/m   BP/Weight 05/10/2017 04/13/2017 12/09/2016  Systolic BP 137 165 157   Diastolic BP 77 103 90  Wt. (Lbs) 201.8 186 198.4  BMI 27.37 25.23 -      Physical Exam  Constitutional: He is oriented to person, place, and time.  Well developed, well nourished, NAD, polite  HENT:  Head: Normocephalic and atraumatic.  Eyes: No scleral icterus.  Neck: Normal range of motion. Neck supple. No thyromegaly present.  Cardiovascular: Normal rate, regular rhythm and normal heart sounds.  Pulmonary/Chest: Effort normal and breath sounds normal.  Musculoskeletal: He exhibits no edema.  Neurological: He is alert and oriented to person, place, and time.  Skin: Skin is warm and dry. No rash noted. No erythema. No pallor.  Psychiatric: He has a normal mood and affect. His behavior is normal. Thought content normal.  Vitals reviewed.    Assessment & Plan:    1. Hypertension, unspecified type - Refill hydrochlorothiazide (HYDRODIURIL) 25 MG tablet; Take 1 tablet (25 mg total) by mouth daily. Take on tablet in the morning.  Dispense: 90 tablet; Refill: 3 - Refill aspirin 81 MG EC tablet; Take 1 tablet (81 mg total) by mouth daily. Swallow whole.  Dispense: 90 tablet; Refill: 3 - Refill amLODipine (NORVASC) 10 MG tablet; Take 1 tablet (10 mg total) by mouth daily.  Dispense: 90 tablet; Refill: 3  2. Elevated lipoprotein(a) - Refill atorvastatin (LIPITOR) 40 MG tablet; Take 1 tablet (40 mg total) by mouth daily.  Dispense: 90 tablet; Refill: 3   Meds ordered this encounter  Medications  . hydrochlorothiazide (  HYDRODIURIL) 25 MG tablet    Sig: Take 1 tablet (25 mg total) by mouth daily. Take on tablet in the morning.    Dispense:  90 tablet    Refill:  3    Order Specific Question:   Supervising Provider    Answer:   Quentin AngstJEGEDE, OLUGBEMIGA E [id number]  . atorvastatin (LIPITOR) 40 MG tablet    Sig: Take 1 tablet (40 mg total) by mouth daily.    Dispense:  90 tablet    Refill:  3    Order Specific Question:   Supervising Provider    Answer:   Quentin AngstJEGEDE, OLUGBEMIGA E  [id number]  . aspirin 81 MG EC tablet    Sig: Take 1 tablet (81 mg total) by mouth daily. Swallow whole.    Dispense:  90 tablet    Refill:  3    Order Specific Question:   Supervising Provider    Answer:   Quentin AngstJEGEDE, OLUGBEMIGA E [id number]  . amLODipine (NORVASC) 10 MG tablet    Sig: Take 1 tablet (10 mg total) by mouth daily.    Dispense:  90 tablet    Refill:  3    Order Specific Question:   Supervising Provider    Answer:   Quentin AngstJEGEDE, OLUGBEMIGA E [id number]    Follow-up: Return in about 6 months (around 11/10/2017) for HTN and Lipid panel.   Loletta Specteroger David Jarron Curley PA

## 2017-05-10 NOTE — Patient Instructions (Signed)

## 2017-11-06 ENCOUNTER — Other Ambulatory Visit: Payer: Self-pay

## 2017-11-06 ENCOUNTER — Emergency Department (HOSPITAL_COMMUNITY): Payer: Self-pay

## 2017-11-06 ENCOUNTER — Emergency Department (HOSPITAL_COMMUNITY)
Admission: EM | Admit: 2017-11-06 | Discharge: 2017-11-06 | Disposition: A | Discharge status: Home / Self Care | Payer: Self-pay | Attending: Emergency Medicine | Admitting: Emergency Medicine

## 2017-11-06 DIAGNOSIS — Z7982 Long term (current) use of aspirin: Secondary | ICD-10-CM | POA: Insufficient documentation

## 2017-11-06 DIAGNOSIS — Z79899 Other long term (current) drug therapy: Secondary | ICD-10-CM | POA: Insufficient documentation

## 2017-11-06 DIAGNOSIS — R0789 Other chest pain: Secondary | ICD-10-CM | POA: Insufficient documentation

## 2017-11-06 DIAGNOSIS — I10 Essential (primary) hypertension: Secondary | ICD-10-CM | POA: Insufficient documentation

## 2017-11-06 DIAGNOSIS — F17210 Nicotine dependence, cigarettes, uncomplicated: Secondary | ICD-10-CM | POA: Insufficient documentation

## 2017-11-06 LAB — CBC
HCT: 43.5 % (ref 39.0–52.0)
Hemoglobin: 14.8 g/dL (ref 13.0–17.0)
MCH: 30.6 pg (ref 26.0–34.0)
MCHC: 34 g/dL (ref 30.0–36.0)
MCV: 89.9 fL (ref 78.0–100.0)
PLATELETS: 193 10*3/uL (ref 150–400)
RBC: 4.84 MIL/uL (ref 4.22–5.81)
RDW: 13 % (ref 11.5–15.5)
WBC: 7.5 10*3/uL (ref 4.0–10.5)

## 2017-11-06 LAB — BASIC METABOLIC PANEL
Anion gap: 11 (ref 5–15)
BUN: 11 mg/dL (ref 6–20)
CALCIUM: 8.2 mg/dL — AB (ref 8.9–10.3)
CO2: 25 mmol/L (ref 22–32)
CREATININE: 0.91 mg/dL (ref 0.61–1.24)
Chloride: 102 mmol/L (ref 98–111)
GFR calc Af Amer: >60 mL/min (ref >60)
GLUCOSE: 103 mg/dL — AB (ref 70–99)
Potassium: 3.1 mmol/L — ABNORMAL LOW (ref 3.5–5.1)
Sodium: 138 mmol/L (ref 135–145)

## 2017-11-06 LAB — I-STAT TROPONIN, ED: TROPONIN I, POC: 0.03 ng/mL (ref 0.00–0.08)

## 2017-11-06 LAB — TROPONIN I: Troponin I: <0.03 ng/mL (ref <0.03)

## 2017-11-06 MED ORDER — RANITIDINE HCL 150 MG PO CAPS: 150.0000 mg | ORAL_CAPSULE | Freq: Every day | ORAL | 0 refills | Status: DC

## 2017-11-06 MED ORDER — GI COCKTAIL ~~LOC~~
30.0000 mL | Freq: Once | ORAL | Status: AC
  Administered 2017-11-06: 30 mL via ORAL
  Filled 2017-11-06: qty 30

## 2017-11-06 NOTE — ED Provider Notes (Signed)
Signout from Dr. Eudelia Bunch.  37 year old male with chest pain.  He is pending a second troponin if that is negative he can be discharged.   Second troponin resulted and unchanged. Patient discharged by Dr Eudelia Bunch.    Terrilee Files, MD 11/07/17 [phone]

## 2017-11-06 NOTE — ED Triage Notes (Signed)
Patient c/o CP that began "1 or 2 days ago". Denies SOB, N/V or diaphoresis.

## 2017-11-06 NOTE — ED Provider Notes (Signed)
[facility] EMERGENCY DEPARTMENT Provider Note  CSN: [id number] Arrival date & time: 11/06/17 7829  Chief Complaint(s) Chest Pain  HPI Thomas Duchesne [facility]. is a 37 y.o. male    Chest Pain   This is a new problem. The current episode started 3 to 5 hours ago. The problem occurs constantly. The problem has been gradually improving. The pain is associated with rest (lying down). The pain is present in the substernal region. The quality of the pain is described as sharp and pressure-like. The pain does not radiate. Pertinent negatives include no back pain, no cough, no fever, no irregular heartbeat, no leg pain, no lower extremity edema, no malaise/fatigue, no nausea, no shortness of breath and no vomiting. Risk factors include male gender and smoking/tobacco exposure.  His past medical history is significant for hyperlipidemia and hypertension.  Pertinent negatives for past medical history include no CAD, no diabetes, no MI, no PE, no strokes and no TIA.  Pertinent negatives for family medical history include: no early MI.    Past Medical History Past Medical History:  Diagnosis Date  . Hypertension 2014   Patient Active Problem List   Diagnosis Date Noted  . Hypertension 12/09/2016   Home Medication(s) Prior to Admission medications   Medication Sig Start Date End Date Taking? Authorizing Provider  amLODipine (NORVASC) 10 MG tablet Take 1 tablet (10 mg total) by mouth daily. 05/10/17   Loletta Specter, PA-C  aspirin 81 MG EC tablet Take 1 tablet (81 mg total) by mouth daily. Swallow whole. 05/10/17   Loletta Specter, PA-C  atorvastatin (LIPITOR) 40 MG tablet Take 1 tablet (40 mg total) by mouth daily. 05/10/17   Loletta Specter, PA-C  hydrochlorothiazide (HYDRODIURIL) 25 MG tablet Take 1 tablet (25 mg total) by mouth daily. Take on tablet in the morning. 05/10/17   Loletta Specter, PA-C  ranitidine (ZANTAC) 150 MG capsule Take 1 capsule (150 mg total) by  mouth daily. 11/06/17   Nira Conn, MD                                                                                                                                    Past Surgical History No past surgical history on file. Family History Family History  Problem Relation Age of Onset  . Cancer Father   . Hypertension Maternal Grandmother   . Hypertension Maternal Grandfather     Social History Social History   Tobacco Use  . Smoking status: Current Every Day Smoker    Packs/day: 0.50    Types: Cigarettes    Start date: 02/28/1993  . Smokeless tobacco: Never Used  Substance Use Topics  . Alcohol use: No    Alcohol/week: 0.0 standard drinks    Comment: EOD  . Drug use: No   Allergies Patient has no known allergies.  Review of Systems Review of Systems  Constitutional: Negative for  fever and malaise/fatigue.  Respiratory: Negative for cough and shortness of breath.   Cardiovascular: Positive for chest pain.  Gastrointestinal: Negative for nausea and vomiting.  Musculoskeletal: Negative for back pain.   All other systems are reviewed and are negative for acute change except as noted in the HPI  Physical Exam Vital Signs  I have reviewed the triage vital signs BP (!) 154/87 (BP Location: Right Arm)   Pulse 80   Temp 98 F (36.7 C) (Oral)   Resp 17   Ht 6' (1.829 m)   Wt 88.5 kg   SpO2 100%   BMI 26.45 kg/m   Physical Exam  Constitutional: He is oriented to person, place, and time. He appears well-developed and well-nourished. No distress.  HENT:  Head: Normocephalic and atraumatic.  Nose: Nose normal.  Eyes: Pupils are equal, round, and reactive to light. Conjunctivae and EOM are normal. Right eye exhibits no discharge. Left eye exhibits no discharge. No scleral icterus.  Neck: Normal range of motion. Neck supple.  Cardiovascular: Normal rate and regular rhythm. Exam reveals no gallop and no friction rub.  No murmur heard. Pulmonary/Chest: Effort  normal and breath sounds normal. No stridor. No respiratory distress. He has no rales.  Abdominal: Soft. He exhibits no distension. There is no tenderness.  Musculoskeletal: He exhibits no edema or tenderness.  Neurological: He is alert and oriented to person, place, and time.  Skin: Skin is warm and dry. No rash noted. He is not diaphoretic. No erythema.  Psychiatric: He has a normal mood and affect.  Vitals reviewed.   ED Results and Treatments Labs (all labs ordered are listed, but only abnormal results are displayed) Labs Reviewed  BASIC METABOLIC PANEL - Abnormal; Notable for the following components:      Result Value   Potassium 3.1 (*)    Glucose, Bld 103 (*)    Calcium 8.2 (*)    All other components within normal limits  CBC  TROPONIN I  I-STAT TROPONIN, ED  I-STAT TROPONIN, ED                                                                                                                         EKG  EKG Interpretation  Date/Time:  Monday November 06 2017 02:29:24 EDT Ventricular Rate:  87 PR Interval:  162 QRS Duration: 88 QT Interval:  378 QTC Calculation: 454 R Axis:   56 Text Interpretation:  Normal sinus rhythm Possible Left atrial enlargement Borderline ECG No old tracing to compare Confirmed by Drema Pry [id number] on 11/06/2017 3:35:18 AM Also confirmed by Drema Pry [phone]), editor Elita Quick ([id number])  on 11/06/2017 6:55:10 AM      Radiology Dg Chest 2 View  Result Date: 11/06/2017 CLINICAL DATA:  Chest pain and pressure since yesterday, history hypertension, smoker EXAM: CHEST - 2 VIEW COMPARISON:  None FINDINGS: Normal heart size, mediastinal contours, and pulmonary vascularity. Lungs clear. No pleural effusion or pneumothorax. Bones unremarkable. IMPRESSION: Normal exam.  Electronically Signed   By: Ulyses Southward M.D.   On: 11/06/2017 02:56   Pertinent labs & imaging results that were available during my care of the patient were reviewed by me  and considered in my medical decision making (see chart for details).  Medications Ordered in ED Medications  gi cocktail (Maalox,Lidocaine,Donnatal) (30 mLs Oral Given 11/06/17 0646)                                                                                                                                    Procedures Procedures  (including critical care time)  Medical Decision Making / ED Course I have reviewed the nursing notes for this encounter and the patient's prior records (if available in EHR or on provided paperwork).    Atypical chest pain inconsistent with ACS.  EKG without acute ischemic changes or evidence of pericarditis.  Initial troponin negative.  Heart score of 3.  Appropriate for delta troponin if rest of the work-up is negative.  Chest x-ray without evidence suggestive of pneumonia, pneumothorax, pneumomediastinum.  No abnormal contour of the mediastinum to suggest dissection. No evidence of acute injuries.  Presentation not classic for aortic dissection or esophageal perforation.  Low suspicion for pulmonary embolism.  Patient provided with GI cocktail resulting in complete resolution of his discomfort.  Rest of the work-up reassuring without leukocytosis or anemia.  Mild hypokalemia without other significant electrolyte derangements.  No renal insufficiency.  Delta trop negative.  The patient appears reasonably screened and/or stabilized for discharge and I doubt any other medical condition or other [facility] requiring further screening, evaluation, or treatment in the ED at this time prior to discharge.  The patient is safe for discharge with strict return precautions.   Final Clinical Impression(s) / ED Diagnoses Final diagnoses:  Atypical chest pain   Disposition: Discharge  Condition: Good  I have discussed the results, Dx and Tx plan with the patient who expressed understanding and agree(s) with the plan. Discharge instructions discussed at great  length. The patient was given strict return precautions who verbalized understanding of the instructions. No further questions at time of discharge.    ED Discharge Orders         Ordered    ranitidine (ZANTAC) 150 MG capsule  Daily     11/06/17 0715           Follow Up: Loletta Specter, PA-C [address] [place] [address] [phone]  Schedule an appointment as soon as possible for a visit  As needed      This chart was dictated using voice recognition software.  Despite best efforts to proofread,  errors can occur which can change the documentation meaning.   Nira Conn, MD 11/06/17 0730

## 2017-11-10 ENCOUNTER — Ambulatory Visit (INDEPENDENT_AMBULATORY_CARE_PROVIDER_SITE_OTHER): Payer: Self-pay | Admitting: Physician Assistant

## 2017-11-10 ENCOUNTER — Other Ambulatory Visit: Payer: Self-pay

## 2017-11-10 ENCOUNTER — Encounter (INDEPENDENT_AMBULATORY_CARE_PROVIDER_SITE_OTHER): Payer: Self-pay | Admitting: Physician Assistant

## 2017-11-10 VITALS — BP 128/77 | HR 63 | Temp 98.2°F | Ht 72.0 in | Wt 190.4 lb

## 2017-11-10 DIAGNOSIS — K219 Gastro-esophageal reflux disease without esophagitis: Secondary | ICD-10-CM

## 2017-11-10 DIAGNOSIS — E7841 Elevated Lipoprotein(a): Secondary | ICD-10-CM

## 2017-11-10 DIAGNOSIS — I10 Essential (primary) hypertension: Secondary | ICD-10-CM

## 2017-11-10 MED ORDER — ATORVASTATIN CALCIUM 40 MG PO TABS: 40.0000 mg | ORAL_TABLET | Freq: Every day | ORAL | 1 refills | Status: DC

## 2017-11-10 MED ORDER — AMLODIPINE BESYLATE 10 MG PO TABS: 10.0000 mg | ORAL_TABLET | Freq: Every day | ORAL | 1 refills | Status: DC

## 2017-11-10 MED ORDER — OMEPRAZOLE 40 MG PO CPDR: 40.0000 mg | DELAYED_RELEASE_CAPSULE | Freq: Every day | ORAL | 1 refills | Status: DC

## 2017-11-10 MED ORDER — HYDROCHLOROTHIAZIDE 25 MG PO TABS: 25.0000 mg | ORAL_TABLET | Freq: Every day | ORAL | 1 refills | Status: DC

## 2017-11-10 MED ORDER — ASPIRIN 81 MG PO TBEC: 81.0000 mg | DELAYED_RELEASE_TABLET | Freq: Every day | ORAL | 3 refills | Status: DC

## 2017-11-10 NOTE — Patient Instructions (Signed)

## 2017-11-10 NOTE — Progress Notes (Signed)
Subjective:  Patient ID: Thomas Likes., male    DOB: [birth year]  Age: 37 y.o. MRN: [id number]  CC: HTN f/u   HPI Thomas Mcleanis a 37 y.o.malewith a medical history of HTN, prediabetes, alcohol abuse, tobacco use, and heartburnpresents to f/u on HTN. Last BP 137/77 mmHg. Taking Amlodipine 10 mg and HCTZ 25 mg as directed. BP 128/77 mmHg today. Feels well except for daily acid reflux and heartburn. Went to ED four days ago for the same and was given a GI cocktail which completely resolved his pain. Has been taking TUMS and Ranitidine for heartburn with relief of symptoms. Does not endorse CP, palpitations, SOB, HA,current abdominal pain, f/c/n/v, rash, swelling, melena, BRBPR, or urinary symptoms.     Outpatient Medications Prior to Visit  Medication Sig Dispense Refill  . amLODipine (NORVASC) 10 MG tablet Take 1 tablet (10 mg total) by mouth daily. 90 tablet 3  . aspirin 81 MG EC tablet Take 1 tablet (81 mg total) by mouth daily. Swallow whole. 90 tablet 3  . atorvastatin (LIPITOR) 40 MG tablet Take 1 tablet (40 mg total) by mouth daily. 90 tablet 3  . hydrochlorothiazide (HYDRODIURIL) 25 MG tablet Take 1 tablet (25 mg total) by mouth daily. Take on tablet in the morning. 90 tablet 3  . ranitidine (ZANTAC) 150 MG capsule Take 1 capsule (150 mg total) by mouth daily. (Patient not taking: Reported on 11/10/2017) 30 capsule 0   No facility-administered medications prior to visit.      ROS Review of Systems  Constitutional: Negative for chills, fever and malaise/fatigue.  Eyes: Negative for blurred vision.  Respiratory: Negative for shortness of breath.   Cardiovascular: Negative for chest pain and palpitations.  Gastrointestinal: Positive for heartburn. Negative for abdominal pain, blood in stool and nausea.  Genitourinary: Negative for dysuria and hematuria.  Musculoskeletal: Negative for joint pain and myalgias.  Skin: Negative for rash.  Neurological: Negative  for tingling and headaches.  Psychiatric/Behavioral: Negative for depression. The patient is not nervous/anxious.     Objective:  BP 128/77 (BP Location: Left Arm, Patient Position: Sitting, Cuff Size: Normal)   Pulse 63   Temp 98.2 F (36.8 C) (Oral)   Ht 6' (1.829 m)   Wt 190 lb 6.4 oz (86.4 kg)   SpO2 99%   BMI 25.82 kg/m   BP/Weight 11/10/2017 11/06/2017 05/10/2017  Systolic BP 128 142 137  Diastolic BP 77 87 77  Wt. (Lbs) 190.4 195 201.8  BMI 25.82 26.45 27.37      Physical Exam  Constitutional: He is oriented to person, place, and time.  Well developed, well nourished, NAD, polite  HENT:  Head: Normocephalic and atraumatic.  Eyes: No scleral icterus.  Neck: Normal range of motion. Neck supple. No thyromegaly present.  Cardiovascular: Normal rate, regular rhythm and normal heart sounds.  Pulmonary/Chest: Effort normal and breath sounds normal.  Abdominal: Soft. Bowel sounds are normal. There is no tenderness.  Musculoskeletal: He exhibits no edema.  Neurological: He is alert and oriented to person, place, and time.  Skin: Skin is warm and dry. No rash noted. No erythema. No pallor.  Psychiatric: He has a normal mood and affect. His behavior is normal. Thought content normal.  Vitals reviewed.    Assessment & Plan:   1. Hypertension, unspecified type - Comprehensive metabolic panel; Future - Refill hydrochlorothiazide (HYDRODIURIL) 25 MG tablet; Take 1 tablet (25 mg total) by mouth daily. Take on tablet in the morning.  Dispense:  90 tablet; Refill: 1 - Refill aspirin 81 MG EC tablet; Take 1 tablet (81 mg total) by mouth daily. Swallow whole.  Dispense: 90 tablet; Refill: 3 - Refill amLODipine (NORVASC) 10 MG tablet; Take 1 tablet (10 mg total) by mouth daily.  Dispense: 90 tablet; Refill: 1  2. Elevated lipoprotein(a) - Lipid panel; Future - Refill atorvastatin (LIPITOR) 40 MG tablet; Take 1 tablet (40 mg total) by mouth daily.  Dispense: 90 tablet; Refill: 1  3.  Gastroesophageal reflux disease, esophagitis presence not specified - Begin Omeprazole 40 mg one tablet po qday, x90 days, #90, one refill    Meds ordered this encounter  Medications  . hydrochlorothiazide (HYDRODIURIL) 25 MG tablet    Sig: Take 1 tablet (25 mg total) by mouth daily. Take on tablet in the morning.    Dispense:  90 tablet    Refill:  1    Order Specific Question:   Supervising Provider    Answer:   Hoy RegisterNEWLIN, ENOBONG [[id number]]  . atorvastatin (LIPITOR) 40 MG tablet    Sig: Take 1 tablet (40 mg total) by mouth daily.    Dispense:  90 tablet    Refill:  1    Order Specific Question:   Supervising Provider    Answer:   Hoy RegisterNEWLIN, ENOBONG [[id number]]  . aspirin 81 MG EC tablet    Sig: Take 1 tablet (81 mg total) by mouth daily. Swallow whole.    Dispense:  90 tablet    Refill:  3    Order Specific Question:   Supervising Provider    Answer:   Hoy RegisterNEWLIN, ENOBONG [[id number]]  . amLODipine (NORVASC) 10 MG tablet    Sig: Take 1 tablet (10 mg total) by mouth daily.    Dispense:  90 tablet    Refill:  1    Order Specific Question:   Supervising Provider    Answer:   Hoy RegisterNEWLIN, ENOBONG [[id number]]  . omeprazole (PRILOSEC) 40 MG capsule    Sig: Take 1 capsule (40 mg total) by mouth daily.    Dispense:  90 capsule    Refill:  1    Order Specific Question:   Supervising Provider    Answer:   Hoy RegisterNEWLIN, ENOBONG [[id number]]    Follow-up: Return in about 6 months (around 05/11/2018) for HTN.   Thomas Specteroger David Patterson Hollenbaugh PA

## 2017-11-16 ENCOUNTER — Other Ambulatory Visit (INDEPENDENT_AMBULATORY_CARE_PROVIDER_SITE_OTHER): Payer: Self-pay | Admitting: Physician Assistant

## 2017-11-16 ENCOUNTER — Other Ambulatory Visit (INDEPENDENT_AMBULATORY_CARE_PROVIDER_SITE_OTHER): Payer: Medicaid Other

## 2017-11-16 ENCOUNTER — Other Ambulatory Visit (INDEPENDENT_AMBULATORY_CARE_PROVIDER_SITE_OTHER): Payer: Self-pay

## 2017-11-16 DIAGNOSIS — R790 Abnormal level of blood mineral: Secondary | ICD-10-CM

## 2017-11-16 DIAGNOSIS — E7841 Elevated Lipoprotein(a): Secondary | ICD-10-CM

## 2017-11-16 DIAGNOSIS — I10 Essential (primary) hypertension: Secondary | ICD-10-CM

## 2017-11-16 NOTE — Progress Notes (Signed)
Labs only collected by onsite [facility] phlebotomist. Maryjean Mornempestt S Roberts, CMA

## 2017-11-17 ENCOUNTER — Telehealth (INDEPENDENT_AMBULATORY_CARE_PROVIDER_SITE_OTHER): Payer: Self-pay

## 2017-11-17 LAB — CBC WITH DIFFERENTIAL/PLATELET
BASOS ABS: 0 10*3/uL (ref 0.0–0.2)
Basos: 1 %
EOS (ABSOLUTE): 0.1 10*3/uL (ref 0.0–0.4)
Eos: 1 %
Hematocrit: 44.4 % (ref 37.5–51.0)
Hemoglobin: 15.6 g/dL (ref 13.0–17.7)
IMMATURE GRANS (ABS): 0 10*3/uL (ref 0.0–0.1)
IMMATURE GRANULOCYTES: 1 %
LYMPHS: 37 %
Lymphocytes Absolute: 2.1 10*3/uL (ref 0.7–3.1)
MCH: 31.1 pg (ref 26.6–33.0)
MCHC: 35.1 g/dL (ref 31.5–35.7)
MCV: 88 fL (ref 79–97)
MONOS ABS: 0.4 10*3/uL (ref 0.1–0.9)
Monocytes: 8 %
NEUTROS PCT: 52 %
Neutrophils Absolute: 3 10*3/uL (ref 1.4–7.0)
PLATELETS: 179 10*3/uL (ref 150–450)
RBC: 5.02 x10E6/uL (ref 4.14–5.80)
RDW: 13.5 % (ref 12.3–15.4)
WBC: 5.7 10*3/uL (ref 3.4–10.8)

## 2017-11-17 LAB — COMPREHENSIVE METABOLIC PANEL
ALK PHOS: 58 IU/L (ref 39–117)
ALT: 19 IU/L (ref 0–44)
AST: 28 IU/L (ref 0–40)
Albumin/Globulin Ratio: 2.2 (ref 1.2–2.2)
Albumin: 4.2 g/dL (ref 3.5–5.5)
BUN / CREAT RATIO: 12 (ref 9–20)
BUN: 10 mg/dL (ref 6–20)
Bilirubin Total: 0.4 mg/dL (ref 0.0–1.2)
CO2: 24 mmol/L (ref 20–29)
CREATININE: 0.85 mg/dL (ref 0.76–1.27)
Calcium: 9.1 mg/dL (ref 8.7–10.2)
Chloride: 98 mmol/L (ref 96–106)
GFR calc Af Amer: 127 mL/min/{1.73_m2} (ref >59)
GFR calc non Af Amer: 110 mL/min/{1.73_m2} (ref >59)
GLOBULIN, TOTAL: 1.9 g/dL (ref 1.5–4.5)
GLUCOSE: 93 mg/dL (ref 65–99)
Potassium: 3.9 mmol/L (ref 3.5–5.2)
SODIUM: 137 mmol/L (ref 134–144)
TOTAL PROTEIN: 6.1 g/dL (ref 6.0–8.5)

## 2017-11-17 LAB — LIPID PANEL
CHOL/HDL RATIO: 2.8 ratio (ref 0.0–5.0)
CHOLESTEROL TOTAL: 139 mg/dL (ref 100–199)
HDL: 49 mg/dL (ref >39)
LDL CALC: 76 mg/dL (ref 0–99)
Triglycerides: 68 mg/dL (ref 0–149)
VLDL CHOLESTEROL CAL: 14 mg/dL (ref 5–40)

## 2017-11-17 NOTE — Telephone Encounter (Signed)
Patient Is aware that labs are normal. Thomas Mclean S [name], CMA

## 2017-11-17 NOTE — Telephone Encounter (Signed)
Patient is aware of normal CBC. Maryjean Mornempestt S Everlene Cunning, CMA

## 2017-11-17 NOTE — Telephone Encounter (Signed)
-----   Message from Loletta Specteroger David Gomez, PA-C sent at 11/17/2017  8:37 AM EDT ----- Normal CBC.

## 2017-11-17 NOTE — Telephone Encounter (Signed)
-----   Message from Loletta Specteroger David Gomez, PA-C sent at 11/17/2017 12:23 PM EDT ----- Normal labs.

## 2018-01-03 ENCOUNTER — Other Ambulatory Visit: Payer: Self-pay

## 2018-01-03 ENCOUNTER — Emergency Department (HOSPITAL_COMMUNITY): Payer: Medicaid Other

## 2018-01-03 ENCOUNTER — Emergency Department (HOSPITAL_COMMUNITY)
Admission: EM | Admit: 2018-01-03 | Discharge: 2018-01-03 | Disposition: A | Discharge status: Home / Self Care | Payer: Medicaid Other | Attending: Emergency Medicine | Admitting: Emergency Medicine

## 2018-01-03 DIAGNOSIS — Z79899 Other long term (current) drug therapy: Secondary | ICD-10-CM | POA: Insufficient documentation

## 2018-01-03 DIAGNOSIS — E876 Hypokalemia: Secondary | ICD-10-CM | POA: Insufficient documentation

## 2018-01-03 DIAGNOSIS — F17210 Nicotine dependence, cigarettes, uncomplicated: Secondary | ICD-10-CM | POA: Insufficient documentation

## 2018-01-03 DIAGNOSIS — I10 Essential (primary) hypertension: Secondary | ICD-10-CM | POA: Insufficient documentation

## 2018-01-03 DIAGNOSIS — R002 Palpitations: Secondary | ICD-10-CM

## 2018-01-03 DIAGNOSIS — Z7982 Long term (current) use of aspirin: Secondary | ICD-10-CM | POA: Insufficient documentation

## 2018-01-03 LAB — I-STAT TROPONIN, ED: TROPONIN I, POC: 0.01 ng/mL (ref 0.00–0.08)

## 2018-01-03 LAB — CBC WITH DIFFERENTIAL/PLATELET
ABS IMMATURE GRANULOCYTES: 0.05 10*3/uL (ref 0.00–0.07)
BASOS PCT: 1 %
Basophils Absolute: 0.1 10*3/uL (ref 0.0–0.1)
Eosinophils Absolute: 0.1 10*3/uL (ref 0.0–0.5)
Eosinophils Relative: 1 %
HCT: 47.8 % (ref 39.0–52.0)
HEMOGLOBIN: 16 g/dL (ref 13.0–17.0)
Immature Granulocytes: 1 %
Lymphocytes Relative: 26 %
Lymphs Abs: 2.1 10*3/uL (ref 0.7–4.0)
MCH: 29.9 pg (ref 26.0–34.0)
MCHC: 33.5 g/dL (ref 30.0–36.0)
MCV: 89.3 fL (ref 80.0–100.0)
MONO ABS: 0.7 10*3/uL (ref 0.1–1.0)
MONOS PCT: 8 %
NEUTROS ABS: 5.2 10*3/uL (ref 1.7–7.7)
Neutrophils Relative %: 63 %
PLATELETS: 216 10*3/uL (ref 150–400)
RBC: 5.35 MIL/uL (ref 4.22–5.81)
RDW: 13.3 % (ref 11.5–15.5)
WBC: 8 10*3/uL (ref 4.0–10.5)
nRBC: 0 % (ref 0.0–0.2)

## 2018-01-03 LAB — COMPREHENSIVE METABOLIC PANEL
ALK PHOS: 54 U/L (ref 38–126)
ALT: 19 U/L (ref 0–44)
AST: 27 U/L (ref 15–41)
Albumin: 4.5 g/dL (ref 3.5–5.0)
Anion gap: 12 (ref 5–15)
BUN: 12 mg/dL (ref 6–20)
CALCIUM: 9.3 mg/dL (ref 8.9–10.3)
CO2: 25 mmol/L (ref 22–32)
CREATININE: 1.01 mg/dL (ref 0.61–1.24)
Chloride: 96 mmol/L — ABNORMAL LOW (ref 98–111)
Glucose, Bld: 94 mg/dL (ref 70–99)
Potassium: 3.3 mmol/L — ABNORMAL LOW (ref 3.5–5.1)
Sodium: 133 mmol/L — ABNORMAL LOW (ref 135–145)
Total Bilirubin: 0.8 mg/dL (ref 0.3–1.2)
Total Protein: 7.4 g/dL (ref 6.5–8.1)

## 2018-01-03 LAB — ETHANOL: Alcohol, Ethyl (B): <10 mg/dL (ref <10)

## 2018-01-03 MED ORDER — POTASSIUM CHLORIDE ER 10 MEQ PO TBCR: 10.0000 meq | EXTENDED_RELEASE_TABLET | Freq: Every day | ORAL | 0 refills | Status: DC

## 2018-01-03 NOTE — ED Provider Notes (Signed)
[facility] EMERGENCY DEPARTMENT Provider Note   CSN: [id number] Arrival date & time: 01/03/18  1628   History   Chief Complaint Chief Complaint  Patient presents with  . Chest Pain  . Hypertension    HPI Thomas Mclean. is a 37 y.o. male.  HPI   37 year old male presents today with complaints of palpitations and hypertension.  Patient notes last night he drank excessive amounts of alcohol.  He woke up this morning with palpitations.  He denies any significant chest pain or shortness of breath.  He notes that the symptoms have completely resolved and has no complaints.  He was slightly nauseous this morning with no vomiting.  Patient denies any cardiac history in him or his family.  Patient does note that he is a smoker.  Past Medical History:  Diagnosis Date  . Hypertension 2014    Patient Active Problem List   Diagnosis Date Noted  . Hypertension 12/09/2016    No past surgical history on file.      Home Medications    Prior to Admission medications   Medication Sig Start Date End Date Taking? Authorizing Provider  amLODipine (NORVASC) 10 MG tablet Take 1 tablet (10 mg total) by mouth daily. 11/10/17   Loletta Specter, PA-C  aspirin 81 MG EC tablet Take 1 tablet (81 mg total) by mouth daily. Swallow whole. 11/10/17   Loletta Specter, PA-C  atorvastatin (LIPITOR) 40 MG tablet Take 1 tablet (40 mg total) by mouth daily. 11/10/17   Loletta Specter, PA-C  hydrochlorothiazide (HYDRODIURIL) 25 MG tablet Take 1 tablet (25 mg total) by mouth daily. Take on tablet in the morning. 11/10/17   Loletta Specter, PA-C  omeprazole (PRILOSEC) 40 MG capsule Take 1 capsule (40 mg total) by mouth daily. 11/10/17   Loletta Specter, PA-C  potassium chloride (K-DUR) 10 MEQ tablet Take 1 tablet (10 mEq total) by mouth daily. 01/03/18   Eyvonne Mechanic, PA-C    Family History Family History  Problem Relation Age of Onset  . Cancer Father   . Hypertension  Maternal Grandmother   . Hypertension Maternal Grandfather     Social History Social History   Tobacco Use  . Smoking status: Current Every Day Smoker    Packs/day: 0.50    Types: Cigarettes    Start date: 02/28/1993  . Smokeless tobacco: Never Used  Substance Use Topics  . Alcohol use: No    Alcohol/week: 0.0 standard drinks    Comment: EOD  . Drug use: No     Allergies   Patient has no known allergies.   Review of Systems Review of Systems  All other systems reviewed and are negative.    Physical Exam Updated Vital Signs BP (!) 141/86 (BP Location: Left Arm)   Pulse 75   Temp 98.3 F (36.8 C) (Oral)   Resp 15   Ht 6' (1.829 m)   Wt 86.2 kg   SpO2 100%   BMI 25.77 kg/m   Physical Exam  Constitutional: He is oriented to person, place, and time. He appears well-developed and well-nourished.  HENT:  Head: Normocephalic and atraumatic.  Eyes: Pupils are equal, round, and reactive to light. Conjunctivae are normal. Right eye exhibits no discharge. Left eye exhibits no discharge. No scleral icterus.  Neck: Normal range of motion. No JVD present. No tracheal deviation present.  Cardiovascular: Normal rate, regular rhythm, normal heart sounds and intact distal pulses. Exam reveals no gallop and  no friction rub.  No murmur heard. Pulmonary/Chest: Effort normal. No stridor.  Musculoskeletal: He exhibits no edema.  Neurological: He is alert and oriented to person, place, and time. Coordination normal.  Psychiatric: He has a normal mood and affect. His behavior is normal. Judgment and thought content normal.  Nursing note and vitals reviewed.   ED Treatments / Results  Labs (all labs ordered are listed, but only abnormal results are displayed) Labs Reviewed  COMPREHENSIVE METABOLIC PANEL - Abnormal; Notable for the following components:      Result Value   Sodium 133 (*)    Potassium 3.3 (*)    Chloride 96 (*)    All other components within normal limits  CBC  WITH DIFFERENTIAL/PLATELET  ETHANOL  I-STAT TROPONIN, ED    EKG EKG Interpretation  Date/Time:  Wednesday January 03 2018 16:35:31 EST Ventricular Rate:  76 PR Interval:    QRS Duration: 91 QT Interval:  379 QTC Calculation: 427 R Axis:   60 Text Interpretation:  Sinus rhythm Biatrial enlargement No significant change since last tracing Confirmed by Richardean Canal [phone] on 01/03/2018 5:24:47 PM   Radiology Dg Chest 2 View  Result Date: 01/03/2018 CLINICAL DATA:  Pounding sensation in the chest with lightheadedness. EXAM: CHEST - 2 VIEW COMPARISON:  11/06/2017. FINDINGS: Normal sized heart.  Clear lungs.  Normal appearing bones. IMPRESSION: Normal examination. Electronically Signed   By: Beckie Salts M.D.   On: 01/03/2018 17:41    Procedures Procedures (including critical care time)  Medications Ordered in ED Medications - No data to display   Initial Impression / Assessment and Plan / ED Course  I have reviewed the triage vital signs and the nursing notes.  Pertinent labs & imaging results that were available during my care of the patient were reviewed by me and considered in my medical decision making (see chart for details).     Labs: I-STAT troponin, ethanol, CBC CMP  Imaging: DG chest 2 view  Consults:  Therapeutics:  Discharge Meds: Potassium  Assessment/Plan: 37 year old male presents today with palpitations.  This likely secondary to excessive alcohol intake yesterday.  He is asymptomatic throughout the evaluation here in the ED.  Reassuring laboratory analysis no signs of ACS.  Patient slightly hypokalemic here discharged, potassium.  He is given strict return precautions, outpatient follow-up.  Patient verbalized understanding and agreement to today's plan had no further questions or concerns at time of discharge.   Final Clinical Impressions(s) / ED Diagnoses   Final diagnoses:  Palpitations  Hypertension, unspecified type  Hypokalemia    ED  Discharge Orders         Ordered    potassium chloride (K-DUR) 10 MEQ tablet  Daily     01/03/18 1917           Eyvonne Mechanic, PA-C 01/03/18 1919    Charlynne Pander, MD 01/03/18 2330

## 2018-01-03 NOTE — Discharge Instructions (Signed)
Please read attached information. If you experience any new or worsening signs or symptoms please return to the emergency room for evaluation. Please follow-up with your primary care provider or specialist as discussed. Please use medication prescribed only as directed and discontinue taking if you have any concerning signs or symptoms.   °

## 2018-01-03 NOTE — ED Notes (Signed)
ED Provider at bedside. 

## 2018-01-03 NOTE — ED Triage Notes (Signed)
Pt on medication for blood pressure, and reports he is not supposed to be drinking. Sts he drank a lot last night and has felt terrible today, chest tightness, bp 194/117, and mild shob. NAD. States this happened the last time he drank like this.

## 2018-03-21 ENCOUNTER — Telehealth: Payer: Self-pay | Admitting: *Deleted

## 2018-03-21 NOTE — Telephone Encounter (Signed)
Patient came over from [facility]

## 2018-07-18 ENCOUNTER — Ambulatory Visit: Payer: Self-pay

## 2018-07-18 ENCOUNTER — Other Ambulatory Visit: Payer: Self-pay

## 2018-07-18 ENCOUNTER — Ambulatory Visit: Payer: Self-pay | Attending: Primary Care | Admitting: Primary Care

## 2018-07-18 ENCOUNTER — Encounter: Payer: Self-pay | Admitting: Primary Care

## 2018-07-18 DIAGNOSIS — E7841 Elevated Lipoprotein(a): Secondary | ICD-10-CM

## 2018-07-18 DIAGNOSIS — L509 Urticaria, unspecified: Secondary | ICD-10-CM

## 2018-07-18 DIAGNOSIS — I10 Essential (primary) hypertension: Secondary | ICD-10-CM

## 2018-07-18 MED ORDER — HYDROCHLOROTHIAZIDE 25 MG PO TABS: 25.0000 mg | ORAL_TABLET | Freq: Every day | ORAL | 0 refills | Status: DC

## 2018-07-18 MED ORDER — AMLODIPINE BESYLATE 10 MG PO TABS: 10.0000 mg | ORAL_TABLET | Freq: Every day | ORAL | 0 refills | Status: DC

## 2018-07-18 MED ORDER — ATORVASTATIN CALCIUM 40 MG PO TABS: 40.0000 mg | ORAL_TABLET | Freq: Every day | ORAL | 0 refills | Status: DC

## 2018-07-18 NOTE — Progress Notes (Signed)
Virtual Visit via Telephone Note  I connected with Thomas Mclean. on 07/18/18 at 10:50 AM EDT by web ex and verified that I am speaking with the correct person using two identifiers.   I discussed the limitations, risks, security and privacy concerns of performing an evaluation and management service by telephone and the availability of in person appointments. I also discussed with the patient that there may be a patient responsible charge related to this service. The patient expressed understanding and agreed to proceed.   History of Present Illness: Thomas Mclean is seen via web ex in order for the provider to evaluate the problem. Patient is c/o right foot swelling at the heel and ankle on the on plantar is red scattered raised areas. Patient is suppose to upload pictures to my chart. Awaiting. Order labs for allergy testing.   Observations/Objective: Review of Systems  HENT: Negative.   Eyes: Negative.   Respiratory: Negative.   Cardiovascular: Negative.   Gastrointestinal: Negative.   Genitourinary: Negative.   Musculoskeletal: Negative.   Skin: Positive for itching and rash.       Right foot only  Neurological: Negative.   Endo/Heme/Allergies: Negative.   Psychiatric/Behavioral: Negative.     Assessment and Plan: Thomas Mclean was seen today for edema and urticaria.  Diagnoses and all orders for this visit:  Hives Unknown etiology r/o allergens  -     Allergens Zone 2  Essential  Unable to ck Bp at home continues to take amlodipine and HCTZ Labs CMP   Elevated lipoprotein Currently on atorvastatin 40 mg pt will rtn for fasting labs   Follow Up Instructions:    I discussed the assessment and treatment plan with the patient. The patient was provided an opportunity to ask questions and all were answered. The patient agreed with the plan and demonstrated an understanding of the instructions.   The patient was advised to call back or seek an in-person evaluation if the  symptoms worsen or if the condition fails to improve as anticipated.  I provided 30 minutes of face-to-face time during this encounter.   Grayce Sessions, NP

## 2018-07-18 NOTE — Progress Notes (Signed)
Patient verified DOB Patient has taken medication. Patient has eaten today. Patient denies pain. Patient complains of edema over the heel and ankle. Monday only edema in the ball of his foot. Patient has used benadryl.

## 2018-07-20 LAB — IGE+ALLERGENS ZONE 2(30)

## 2018-07-26 NOTE — Progress Notes (Signed)
Please share allergy test was negative and remind patient to upload photo of his foot to [employer]

## 2018-08-06 ENCOUNTER — Encounter: Payer: Self-pay | Admitting: Primary Care

## 2018-08-16 ENCOUNTER — Ambulatory Visit: Payer: Medicaid Other | Admitting: Primary Care

## 2018-08-21 ENCOUNTER — Ambulatory Visit: Payer: Medicaid Other | Admitting: Primary Care

## 2018-12-01 ENCOUNTER — Other Ambulatory Visit: Payer: Self-pay

## 2018-12-01 ENCOUNTER — Encounter (HOSPITAL_COMMUNITY): Payer: Self-pay | Admitting: *Deleted

## 2018-12-01 ENCOUNTER — Emergency Department (HOSPITAL_COMMUNITY)
Admission: EM | Admit: 2018-12-01 | Discharge: 2018-12-01 | Disposition: A | Discharge status: Home / Self Care | Payer: Medicaid Other | Attending: Emergency Medicine | Admitting: Emergency Medicine

## 2018-12-01 DIAGNOSIS — K6289 Other specified diseases of anus and rectum: Secondary | ICD-10-CM

## 2018-12-01 DIAGNOSIS — Z7982 Long term (current) use of aspirin: Secondary | ICD-10-CM | POA: Insufficient documentation

## 2018-12-01 DIAGNOSIS — K5900 Constipation, unspecified: Secondary | ICD-10-CM | POA: Insufficient documentation

## 2018-12-01 DIAGNOSIS — F17210 Nicotine dependence, cigarettes, uncomplicated: Secondary | ICD-10-CM | POA: Insufficient documentation

## 2018-12-01 DIAGNOSIS — I10 Essential (primary) hypertension: Secondary | ICD-10-CM | POA: Insufficient documentation

## 2018-12-01 HISTORY — DX: Pure hypercholesterolemia, unspecified: E78.00

## 2018-12-01 NOTE — ED Triage Notes (Signed)
Pt states no bm x 3 days.  States rectal pain, intermittent.  Denies abdominal pain.

## 2018-12-01 NOTE — Discharge Instructions (Signed)
Today you were seen in the emergency room for rectal pain and constipation. Please use 1-2 fleets enemas.  If you do not get any bowel movement or relief after 1 enema you may wait 2 to 3 hours and then repeat.  Please also start taking MiraLAX.  You may take up to 8 capfuls with 8 cups of water however this will most likely produce abdominal cramps and pain.  I would recommend that you start by taking 1 capful of MiraLAX 3 times a day.  You may still have some crampy abdominal pain with this.  It can take 1 to 3 days for MiraLAX to produce a bowel movement.  Once you feel that your constipation has resolved I would recommend that you take 1 capful of MiraLAX every day for at least the next month.  As we discussed you can have diarrhea and still be constipated and you can also have a bowel movement every day and still be constipated.    If you develop abdominal pain that is concerning, fevers, vomiting, or have any additional concerns please seek additional medical care and evaluation.  We discussed alternative options for treatment today including blood work and CAT scan which you declined today.    While in the ED your blood pressure was high.  Please follow up with your primary care doctor or the wellness clinic for repeat evaluation in the next week as you may need medication.  High blood pressure can cause long term, potentially serious, damage if left untreated.

## 2018-12-01 NOTE — ED Notes (Signed)
Pt reports no BM since Monday. He has taken a laxative and a preparation H suppository without relief.

## 2018-12-01 NOTE — ED Provider Notes (Signed)
[facility]   CSN: [id number] Arrival date & time: 12/01/18  1556     History   Chief Complaint Chief Complaint  Patient presents with   Rectal Pain   Constipation    HPI [facility]Kenneth [facility] is a 38 y.o. male  With a past medical history of HTN, HLD, who presents today for evaluation of rectal pain and inability to have a bowel movement for 5 days.  He reports that he had a friend visit and they sat outside on the porch drinking beers for multiple hours and when he stood up he felt pain in his rectum.  He has never had anything like this before.  He states that the pain is intermittent.  It is worsened with attempting to have a bowel movement and when he does strain he feels like it occasionally radiates into his testicles however denies testicular pain.  He denies any concern for rectal trauma.  He does not participate in receptive anal sex.  He has been taking Motrin and soaking in Epsom salt baths with moderate relief however he is still been unable to have a bowel movement.  He reports feeling generally bloated.  He denies any fevers, nausea or vomiting.  He denies any diet changes.  He denies any opioid use or history of similar constipation.  No previous abdominal surgeries.  He denies any back pain.  No changes to bladder function or concern for urinary retention.     HPI  Past Medical History:  Diagnosis Date   Hypercholesteremia    Hypertension 2014    Patient Active Problem List   Diagnosis Date Noted   Hypertension 12/09/2016    History reviewed. No pertinent surgical history.      Home Medications    Prior to Admission medications   Medication Sig Start Date End Date Taking? Authorizing Provider  amLODipine (NORVASC) 10 MG tablet Take 1 tablet (10 mg total) by mouth daily. 07/18/18   Grayce SessionsEdwards, Michelle P, NP  aspirin 81 MG EC tablet Take 1 tablet (81 mg total) by mouth daily. Swallow whole. 11/10/17    Loletta SpecterGomez, Roger David, PA-C  atorvastatin (LIPITOR) 40 MG tablet Take 1 tablet (40 mg total) by mouth daily. 07/18/18   Grayce SessionsEdwards, Michelle P, NP  hydrochlorothiazide (HYDRODIURIL) 25 MG tablet Take 1 tablet (25 mg total) by mouth daily. Take on tablet in the morning. 07/18/18   Grayce SessionsEdwards, Michelle P, NP  omeprazole (PRILOSEC) 40 MG capsule Take 1 capsule (40 mg total) by mouth daily. Patient not taking: Reported on 07/18/2018 11/10/17   Loletta SpecterGomez, Roger David, PA-C  potassium chloride (K-DUR) 10 MEQ tablet Take 1 tablet (10 mEq total) by mouth daily. Patient not taking: Reported on 07/18/2018 01/03/18   Eyvonne MechanicHedges, Jeffrey, PA-C    Family History Family History  Problem Relation Age of Onset   Cancer Father    Hypertension Maternal Grandmother    Hypertension Maternal Grandfather     Social History Social History   Tobacco Use   Smoking status: Current Every Day Smoker    Packs/day: 0.50    Types: Cigarettes    Start date: 02/28/1993   Smokeless tobacco: Never Used  Substance Use Topics   Alcohol use: No    Alcohol/week: 0.0 standard drinks    Comment: EOD   Drug use: No     Allergies   Patient has no known allergies.   Review of Systems Review of Systems  Constitutional: Negative for chills  and fever.  HENT: Negative for congestion.   Eyes: Negative for visual disturbance.  Respiratory: Negative for chest tightness and shortness of breath.   Cardiovascular: Negative for chest pain.  Gastrointestinal: Positive for constipation and rectal pain. Negative for abdominal pain, diarrhea, nausea and vomiting.  Genitourinary: Negative for decreased urine volume, difficulty urinating, dysuria, frequency, scrotal swelling, testicular pain and urgency.  Musculoskeletal: Negative for back pain and neck pain.  Skin: Negative for color change, rash and wound.  Neurological: Negative for weakness and headaches.  Psychiatric/Behavioral: Negative for confusion.  All other systems reviewed and are  negative.    Physical Exam Updated Vital Signs BP (!) 158/95 (BP Location: Left Arm)    Pulse 62    Temp 98.2 F (36.8 C) (Oral)    Resp 18    Ht 6' (1.829 m)    Wt 90.7 kg    SpO2 98%    BMI 27.12 kg/m   Physical Exam Vitals signs and nursing note reviewed. Exam conducted with a chaperone present Scientist, research (medical)).  Constitutional:      Appearance: He is well-developed.  HENT:     Head: Normocephalic and atraumatic.  Eyes:     Conjunctiva/sclera: Conjunctivae normal.  Neck:     Musculoskeletal: Normal range of motion and neck supple.  Cardiovascular:     Rate and Rhythm: Normal rate and regular rhythm.     Heart sounds: No murmur.  Pulmonary:     Effort: Pulmonary effort is normal. No respiratory distress.     Breath sounds: Normal breath sounds.  Abdominal:     General: Abdomen is flat. There is distension (Mild).     Palpations: Abdomen is soft. There is no mass.     Tenderness: There is no abdominal tenderness. There is no guarding or rebound.  Genitourinary:    Comments: There is no perirectal swelling, induration, or fluctuance.  No external hemorrhoids.  Digital rectal exam performed with good rectal tone, no significant pain with exam.  There is a large amount of firm stool in the rectal vault without evidence of frank melena. Skin:    General: Skin is warm and dry.  Neurological:     General: No focal deficit present.     Mental Status: He is alert.     Cranial Nerves: No cranial nerve deficit.  Psychiatric:        Mood and Affect: Mood normal.        Behavior: Behavior normal.      ED Treatments / Results  Labs (all labs ordered are listed, but only abnormal results are displayed) Labs Reviewed - No data to display  EKG None  Radiology No results found.  Procedures Procedures (including critical care time)  Medications Ordered in ED Medications - No data to display   Initial Impression / Assessment and Plan / ED Course  I have reviewed the triage  vital signs and the nursing notes.  Pertinent labs & imaging results that were available during my care of the patient were reviewed by me and considered in my medical decision making (see chart for details).       Patient presents today for evaluation of rectal pain and inability to have a bowel movement for 5 days.  He denies any trauma.  No nausea or vomiting and he does not have a history of prior abdominal surgeries.  Rectal exam performed with chaperone revealing a large amount of firm stool in the rectal vault however no evidence of hemorrhoids.  He is afebrile, his abdomen is soft and nontender, he reports it feels bloated/distended.  We discussed options for evaluation and treatment today including lab work and CT scan to evaluate for for intra-abdominal abnormalities or complications.  We also discussed the option of attempting treatment at home with enemas and MiraLAX.  We discussed risks and benefits of both of these treatment paths and he elected to forego CT scan and lab work today after discussion of risks of this decision.  He is instructed to use fleets enema at home and take MiraLAX.  He is advised that it may take 1 to 3 days for the MiraLAX to produce a bowel movement.  He is not having nausea or vomiting and does not have a history of prior abdominal surgeries, and is not having abdominal pain therefore of lowers concern for bowel obstruction.  Return precautions were discussed with patient who states their understanding.  At the time of discharge patient denied any unaddressed complaints or concerns.  Patient is agreeable for discharge home.   Final Clinical Impressions(s) / ED Diagnoses   Final diagnoses:  Constipation, unspecified constipation type  Rectal pain    ED Discharge Orders    None       Norman Clay 12/01/18 1804    Tegeler, Canary Brim, MD 12/01/18 [phone]

## 2018-12-01 NOTE — ED Notes (Signed)
Patient verbalizes understanding of discharge instructions. Opportunity for questioning and answers were provided. Armband removed by staff, pt discharged from ED.  

## 2018-12-02 ENCOUNTER — Encounter (HOSPITAL_COMMUNITY): Payer: Self-pay | Admitting: Emergency Medicine

## 2018-12-02 ENCOUNTER — Emergency Department (HOSPITAL_COMMUNITY): Payer: Self-pay

## 2018-12-02 ENCOUNTER — Other Ambulatory Visit: Payer: Self-pay

## 2018-12-02 ENCOUNTER — Emergency Department (HOSPITAL_COMMUNITY)
Admission: EM | Admit: 2018-12-02 | Discharge: 2018-12-02 | Disposition: A | Discharge status: Home / Self Care | Payer: Self-pay | Attending: Emergency Medicine | Admitting: Emergency Medicine

## 2018-12-02 DIAGNOSIS — Z7982 Long term (current) use of aspirin: Secondary | ICD-10-CM | POA: Insufficient documentation

## 2018-12-02 DIAGNOSIS — R197 Diarrhea, unspecified: Secondary | ICD-10-CM | POA: Insufficient documentation

## 2018-12-02 DIAGNOSIS — I10 Essential (primary) hypertension: Secondary | ICD-10-CM | POA: Insufficient documentation

## 2018-12-02 DIAGNOSIS — F17210 Nicotine dependence, cigarettes, uncomplicated: Secondary | ICD-10-CM | POA: Insufficient documentation

## 2018-12-02 DIAGNOSIS — K5900 Constipation, unspecified: Secondary | ICD-10-CM | POA: Insufficient documentation

## 2018-12-02 DIAGNOSIS — Z79899 Other long term (current) drug therapy: Secondary | ICD-10-CM | POA: Insufficient documentation

## 2018-12-02 LAB — COMPREHENSIVE METABOLIC PANEL
ALT: 19 U/L (ref 0–44)
AST: 25 U/L (ref 15–41)
Albumin: 4.3 g/dL (ref 3.5–5.0)
Alkaline Phosphatase: 48 U/L (ref 38–126)
Anion gap: 11 (ref 5–15)
BUN: 8 mg/dL (ref 6–20)
CO2: 24 mmol/L (ref 22–32)
Calcium: 9.6 mg/dL (ref 8.9–10.3)
Chloride: 103 mmol/L (ref 98–111)
Creatinine, Ser: 0.77 mg/dL (ref 0.61–1.24)
GFR calc Af Amer: >60 mL/min (ref >60)
GFR calc non Af Amer: >60 mL/min (ref >60)
Glucose, Bld: 113 mg/dL — ABNORMAL HIGH (ref 70–99)
Potassium: 3.7 mmol/L (ref 3.5–5.1)
Sodium: 138 mmol/L (ref 135–145)
Total Bilirubin: 1 mg/dL (ref 0.3–1.2)
Total Protein: 7.4 g/dL (ref 6.5–8.1)

## 2018-12-02 LAB — CBC WITH DIFFERENTIAL/PLATELET
Abs Immature Granulocytes: 0.03 10*3/uL (ref 0.00–0.07)
Basophils Absolute: 0.1 10*3/uL (ref 0.0–0.1)
Basophils Relative: 1 %
Eosinophils Absolute: 0.1 10*3/uL (ref 0.0–0.5)
Eosinophils Relative: 1 %
HCT: 45.1 % (ref 39.0–52.0)
Hemoglobin: 15.9 g/dL (ref 13.0–17.0)
Immature Granulocytes: 0 %
Lymphocytes Relative: 19 %
Lymphs Abs: 1.6 10*3/uL (ref 0.7–4.0)
MCH: 31.8 pg (ref 26.0–34.0)
MCHC: 35.3 g/dL (ref 30.0–36.0)
MCV: 90.2 fL (ref 80.0–100.0)
Monocytes Absolute: 0.7 10*3/uL (ref 0.1–1.0)
Monocytes Relative: 8 %
Neutro Abs: 6.2 10*3/uL (ref 1.7–7.7)
Neutrophils Relative %: 71 %
Platelets: 191 10*3/uL (ref 150–400)
RBC: 5 MIL/uL (ref 4.22–5.81)
RDW: 13.1 % (ref 11.5–15.5)
WBC: 8.7 10*3/uL (ref 4.0–10.5)
nRBC: 0 % (ref 0.0–0.2)

## 2018-12-02 LAB — URINALYSIS, ROUTINE W REFLEX MICROSCOPIC
Bilirubin Urine: NEGATIVE
Glucose, UA: NEGATIVE mg/dL
Hgb urine dipstick: NEGATIVE
Ketones, ur: NEGATIVE mg/dL
Leukocytes,Ua: NEGATIVE
Nitrite: NEGATIVE
Protein, ur: NEGATIVE mg/dL
Specific Gravity, Urine: 1.009 (ref 1.005–1.030)
pH: 6 (ref 5.0–8.0)

## 2018-12-02 MED ORDER — SENNOSIDES-DOCUSATE SODIUM 8.6-50 MG PO TABS: 1.0000 | ORAL_TABLET | Freq: Every day | ORAL | 0 refills | Status: DC

## 2018-12-02 MED ORDER — IOHEXOL 300 MG/ML  SOLN
100.0000 mL | Freq: Once | INTRAMUSCULAR | Status: AC | PRN
  Administered 2018-12-02: 10:00:00 100 mL via INTRAVENOUS

## 2018-12-02 MED ORDER — FLEET ENEMA 7-19 GM/118ML RE ENEM
1.0000 | ENEMA | Freq: Once | RECTAL | Status: AC
  Administered 2018-12-02: 1 via RECTAL
  Filled 2018-12-02: qty 1

## 2018-12-02 MED ORDER — POLYETHYLENE GLYCOL 3350 17 G PO PACK
17.0000 g | PACK | Freq: Every day | ORAL | Status: DC
  Administered 2018-12-02: 12:00:00 17 g via ORAL
  Filled 2018-12-02: qty 1

## 2018-12-02 NOTE — ED Notes (Signed)
With enema, pt unable to have bowel movement. EDP notified.

## 2018-12-02 NOTE — ED Provider Notes (Signed)
[facility] Provider Note   CSN: [id number] Arrival date & time: 12/02/18  0601     History   Chief Complaint Chief Complaint  Patient presents with  . Constipation    HPI Thomas LikesKenneth Ray Veach Jr. is a 38 y.o. male.  Presents the ER with rectal pain, constipation.  States he has been unable to have a regular bowel movement in 6 days.  Over the same time.  He is also noted sensation of rectal fullness, rectal pain, sensation that he needs to have a bowel movement but is unable to.  As noted passing gas and having small amount of liquid stool occasionally come out.  No blood in stool.  Took 1 dose of MiraLAX last night without significant change.  Has not noted any swelling there, no fevers or vomiting.  He went to the emergency department yesterday, recommended aggressive bowel regimen and return to ER for worsening or persistent symptoms.   Review chart, notes from Dr. Rush Landmarkegeler.     HPI  Past Medical History:  Diagnosis Date  . Hypercholesteremia   . Hypertension 2014    Patient Active Problem List   Diagnosis Date Noted  . Hypertension 12/09/2016    History reviewed. No pertinent surgical history.      Home Medications    Prior to Admission medications   Medication Sig Start Date End Date Taking? Authorizing Provider  amLODipine (NORVASC) 10 MG tablet Take 1 tablet (10 mg total) by mouth daily. 07/18/18  Yes Grayce SessionsEdwards, Michelle P, NP  aspirin 81 MG EC tablet Take 1 tablet (81 mg total) by mouth daily. Swallow whole. 11/10/17  Yes Loletta SpecterGomez, Roger David, PA-C  atorvastatin (LIPITOR) 40 MG tablet Take 1 tablet (40 mg total) by mouth daily. 07/18/18  Yes Grayce SessionsEdwards, Michelle P, NP  hydrochlorothiazide (HYDRODIURIL) 25 MG tablet Take 1 tablet (25 mg total) by mouth daily. Take on tablet in the morning. 07/18/18  Yes Grayce SessionsEdwards, Michelle P, NP  ibuprofen (ADVIL) 200 MG tablet Take 400 mg by mouth every 6 (six) hours as needed for headache, mild pain or  moderate pain.   Yes [provider]  omeprazole (PRILOSEC) 40 MG capsule Take 1 capsule (40 mg total) by mouth daily. Patient not taking: Reported on 07/18/2018 11/10/17   Loletta SpecterGomez, Roger David, PA-C  potassium chloride (K-DUR) 10 MEQ tablet Take 1 tablet (10 mEq total) by mouth daily. Patient not taking: Reported on 07/18/2018 01/03/18   Hedges, Tinnie GensJeffrey, PA-C  senna-docusate (SENOKOT-S) 8.6-50 MG tablet Take 1 tablet by mouth daily. 12/02/18   Milagros Lollykstra, Sinai Illingworth S, MD    Family History Family History  Problem Relation Age of Onset  . Cancer Father   . Hypertension Maternal Grandmother   . Hypertension Maternal Grandfather     Social History Social History   Tobacco Use  . Smoking status: Current Every Day Smoker    Packs/day: 0.50    Types: Cigarettes    Start date: 02/28/1993  . Smokeless tobacco: Never Used  Substance Use Topics  . Alcohol use: No    Alcohol/week: 0.0 standard drinks    Comment: EOD  . Drug use: No     Allergies   Patient has no known allergies.   Review of Systems Review of Systems  Constitutional: Negative for chills and fever.  HENT: Negative for ear pain and sore throat.   Eyes: Negative for pain and visual disturbance.  Respiratory: Negative for cough and shortness of breath.   Cardiovascular: Negative  for chest pain and palpitations.  Gastrointestinal: Positive for abdominal pain, constipation and rectal pain. Negative for vomiting.  Genitourinary: Negative for dysuria and hematuria.  Musculoskeletal: Negative for arthralgias and back pain.  Skin: Negative for color change and rash.  Neurological: Negative for seizures and syncope.  All other systems reviewed and are negative.    Physical Exam Updated Vital Signs BP (!) 152/79 (BP Location: Right Arm)   Pulse 70   Temp 98.2 F (36.8 C) (Oral)   Resp 18   SpO2 100%   Physical Exam Vitals signs and nursing note reviewed.  Constitutional:      Appearance: He is well-developed.   HENT:     Head: Normocephalic and atraumatic.  Eyes:     Conjunctiva/sclera: Conjunctivae normal.  Neck:     Musculoskeletal: Neck supple.  Cardiovascular:     Rate and Rhythm: Normal rate and regular rhythm.     Heart sounds: No murmur.  Pulmonary:     Effort: Pulmonary effort is normal. No respiratory distress.     Breath sounds: Normal breath sounds.  Abdominal:     Palpations: Abdomen is soft.     Tenderness: There is no abdominal tenderness.  Genitourinary:    Penis: Normal.      Scrotum/Testes: Normal.     Comments: Small brown stool at rectum, no fissures noted, no external hemorrhoids, no tenderness or fullness around the rectum palpated, there is mild tenderness over the perineum, no crepitus or induration was appreciated Skin:    General: Skin is warm and dry.  Neurological:     Mental Status: He is alert.      ED Treatments / Results  Labs (all labs ordered are listed, but only abnormal results are displayed) Labs Reviewed  COMPREHENSIVE METABOLIC PANEL - Abnormal; Notable for the following components:      Result Value   Glucose, Bld 113 (*)    All other components within normal limits  URINALYSIS, ROUTINE W REFLEX MICROSCOPIC  CBC WITH DIFFERENTIAL/PLATELET    EKG None  Radiology Dg Abdomen 1 View  Result Date: 12/02/2018 CLINICAL DATA:  Constipation EXAM: ABDOMEN - 1 VIEW COMPARISON:  None FINDINGS: There is a large amount of stool in the ascending colon and rectum. There is no bowel dilatation to suggest obstruction. There is no evidence of pneumoperitoneum, portal venous gas or pneumatosis. There are no pathologic calcifications along the expected course of the ureters. The osseous structures are unremarkable. IMPRESSION: Large amount of stool in the ascending colon and rectum. Electronically Signed   By: Kathreen Devoid   On: 12/02/2018 07:53   Ct Abdomen Pelvis W Contrast  Result Date: 12/02/2018 CLINICAL DATA:  Generalized abdominal pain with perineal  tenderness. Possible abscess. Rectal pain several days. EXAM: CT ABDOMEN AND PELVIS WITH CONTRAST TECHNIQUE: Multidetector CT imaging of the abdomen and pelvis was performed using the standard protocol following bolus administration of intravenous contrast. CONTRAST:  142mL OMNIPAQUE IOHEXOL 300 MG/ML  SOLN COMPARISON:  None. FINDINGS: Lower chest: Lung bases are clear. Hepatobiliary: Liver, gallbladder and biliary tree are normal. Pancreas: Normal. Spleen: Normal. Adrenals/Urinary Tract: Adrenal glands are normal. Kidneys are normal in size without hydronephrosis or nephrolithiasis. Ureters and bladder are normal. Stomach/Bowel: Stomach and small bowel are normal. Appendix is normal. Minimal fecal retention throughout the colon. Moderate fecal retention over the rectum with mild associated wall thickening of the rectum. Small amount of fluid in the presacral space. No evidence of perirectal/perianal abscess. No focal inflammatory process over  the perirectal/perianal region nor over the soft tissues of the perineum. Vascular/Lymphatic: Normal. Reproductive: Normal. Other: None. Musculoskeletal: Normal. IMPRESSION: 1. No evidence of perirectal, perianal or perineal abscess. There is moderate fecal retention over the rectum with mild rectal wall thickening with minimal fluid in the presacral space. Findings may be due to mild proctitis. Electronically Signed   By: Elberta Fortis M.D.   On: 12/02/2018 10:30    Procedures Procedures (including critical care time)  Medications Ordered in ED Medications  polyethylene glycol (MIRALAX / GLYCOLAX) packet 17 g (17 g Oral Given 12/02/18 1226)  iohexol (OMNIPAQUE) 300 MG/ML solution 100 mL (100 mLs Intravenous Contrast Given 12/02/18 0958)  sodium phosphate (FLEET) 7-19 GM/118ML enema 1 enema (1 enema Rectal Given 12/02/18 1149)     Initial Impression / Assessment and Plan / ED Course  I have reviewed the triage vital signs and the nursing notes.  Pertinent labs &  imaging results that were available during my care of the patient were reviewed by me and considered in my medical decision making (see chart for details).  Clinical Course as of Dec 01 1452  Wynelle Link Dec 02, 2018  4944 Alycia Rossetti RN chaperoned rectal exam   [RD]  [phone] Completed initial assessment, will get labs and CT for further eval   [RD]  1052 Reviewed CTs, will update patient   [RD]    Clinical Course User Index [RD] Milagros Loll, MD       38 year old male with constipation, abdominal pain.  On exam well-appearing with soft abdomen, noted mild tenderness in the perineum.  Second visit, check labs, CT scan.  No acute abdominal process, noted significant constipation.  Suspect this is the etiology for his complaints.  Patient only taken 1 dose of MiraLAX previously.  I recommended aggressive bowel regimen with patient.  Gave enema, dose of MiraLAX here prior to discharge ended up having a large bowel movement.  Reviewed return precautions and recommended close recheck with his PCP.    After the discussed management above, the patient was determined to be safe for discharge.  The patient was in agreement with this plan and all questions regarding their care were answered.  ED return precautions were discussed and the patient will return to the ED with any significant worsening of condition.    Final Clinical Impressions(s) / ED Diagnoses   Final diagnoses:  Constipation, unspecified constipation type    ED Discharge Orders         Ordered    senna-docusate (SENOKOT-S) 8.6-50 MG tablet  Daily     12/02/18 1228           Milagros Loll, MD 12/02/18 1454

## 2018-12-02 NOTE — Discharge Instructions (Addendum)
Recommend taking 1 serving of MiraLAX every 2 hours for up to 5 doses until you start to have a bowel movement.  Can repeat this tomorrow if you are still struggling to have a bowel movement.  Please also take the newly prescribed Peri-Colace.  If you feel you are pain worsens, you have vomiting, fever, recommend returning to ER for recheck.  Otherwise I recommend calling your primary doctor to schedule follow-up appointment Monday or Tuesday.

## 2018-12-02 NOTE — ED Triage Notes (Signed)
Patient states that this has been day 6 with no BM.  Patient states that the rectal pain has gotten worse.  No abdominal pain.  No vomiting.

## 2018-12-02 NOTE — ED Notes (Signed)
Patient transported to CT 

## 2019-02-27 ENCOUNTER — Ambulatory Visit (INDEPENDENT_AMBULATORY_CARE_PROVIDER_SITE_OTHER): Payer: Self-pay | Admitting: Primary Care

## 2019-02-27 ENCOUNTER — Encounter (INDEPENDENT_AMBULATORY_CARE_PROVIDER_SITE_OTHER): Payer: Self-pay | Admitting: Primary Care

## 2019-02-27 ENCOUNTER — Other Ambulatory Visit: Payer: Self-pay

## 2019-02-27 VITALS — BP 154/76 | HR 58 | Temp 96.8°F | Ht 72.0 in | Wt 198.2 lb

## 2019-02-27 DIAGNOSIS — F17210 Nicotine dependence, cigarettes, uncomplicated: Secondary | ICD-10-CM

## 2019-02-27 DIAGNOSIS — F17200 Nicotine dependence, unspecified, uncomplicated: Secondary | ICD-10-CM

## 2019-02-27 DIAGNOSIS — I10 Essential (primary) hypertension: Secondary | ICD-10-CM

## 2019-02-27 NOTE — Progress Notes (Signed)
Established Patient Office Visit  Subjective:  Patient ID: Thomas Mclean Thomas Mclean., male    DOB: 02-Jul-1980  Age: 38 y.o. MRN: [id number]  CC:  Chief Complaint  Patient presents with  . Follow-up    HTN    HPI [name] [facility]. presents for follow up on blood pressure. Elevated 154/76 uncontrolled. He denies shortness of breath, headaches, chest pain or lower extremity edema  Past Medical History:  Diagnosis Date  . Hypercholesteremia   . Hypertension 2014    History reviewed. No pertinent surgical history.  Family History  Problem Relation Age of Onset  . Cancer Father   . Hypertension Maternal Grandmother   . Hypertension Maternal Grandfather     Social History   Socioeconomic History  . Marital status: Single    Spouse name: Not on file  . Number of children: Not on file  . Years of education: Not on file  . Highest education level: Not on file  Occupational History  . Not on file  Tobacco Use  . Smoking status: Current Every Day Smoker    Packs/day: 0.50    Types: Cigarettes    Start date: 02/28/1993  . Smokeless tobacco: Never Used  Substance and Sexual Activity  . Alcohol use: No    Alcohol/week: 0.0 standard drinks    Comment: EOD  . Drug use: No  . Sexual activity: Yes  Other Topics Concern  . Not on file  Social History Narrative  . Not on file   Social Determinants of Health   Financial Resource Strain:   . Difficulty of Paying Living Expenses: Not on file  Food Insecurity:   . Worried About Charity fundraiser in the Last Year: Not on file  . Ran Out of Food in the Last Year: Not on file  Transportation Needs:   . Lack of Transportation (Medical): Not on file  . Lack of Transportation (Non-Medical): Not on file  Physical Activity:   . Days of Exercise per Week: Not on file  . Minutes of Exercise per Session: Not on file  Stress:   . Feeling of Stress : Not on file  Social Connections:   . Frequency of Communication with Friends  and Family: Not on file  . Frequency of Social Gatherings with Friends and Family: Not on file  . Attends Religious Services: Not on file  . Active Member of Clubs or Organizations: Not on file  . Attends Archivist Meetings: Not on file  . Marital Status: Not on file  Intimate Partner Violence:   . Fear of Current or Ex-Partner: Not on file  . Emotionally Abused: Not on file  . Physically Abused: Not on file  . Sexually Abused: Not on file    Outpatient Medications Prior to Visit  Medication Sig Dispense Refill  . amLODipine (NORVASC) 10 MG tablet Take 1 tablet (10 mg total) by mouth daily. 90 tablet 0  . aspirin 81 MG EC tablet Take 1 tablet (81 mg total) by mouth daily. Swallow whole. (Patient not taking: Reported on 02/27/2019) 90 tablet 3  . atorvastatin (LIPITOR) 40 MG tablet Take 1 tablet (40 mg total) by mouth daily. 90 tablet 0  . hydrochlorothiazide (HYDRODIURIL) 25 MG tablet Take 1 tablet (25 mg total) by mouth daily. Take on tablet in the morning. (Patient not taking: Reported on 02/27/2019) 90 tablet 0  . senna-docusate (SENOKOT-S) 8.6-50 MG tablet Take 1 tablet by mouth daily. (Patient not taking: Reported on  02/27/2019) 30 tablet 0  . ibuprofen (ADVIL) 200 MG tablet Take 400 mg by mouth every 6 (six) hours as needed for headache, mild pain or moderate pain.    Marland Kitchen omeprazole (PRILOSEC) 40 MG capsule Take 1 capsule (40 mg total) by mouth daily. (Patient not taking: Reported on 07/18/2018) 90 capsule 1  . potassium chloride (K-DUR) 10 MEQ tablet Take 1 tablet (10 mEq total) by mouth daily. (Patient not taking: Reported on 07/18/2018) 15 tablet 0   No facility-administered medications prior to visit.    No Known Allergies  ROS Review of Systems  All other systems reviewed and are negative.     Objective:    Physical Exam  Constitutional: He is oriented to person, place, and time. He appears well-developed and well-nourished.  Cardiovascular: Normal rate and  regular rhythm.  Pulmonary/Chest: Effort normal and breath sounds normal.  Abdominal: Bowel sounds are normal.  Musculoskeletal:        General: Normal range of motion.     Cervical back: Neck supple.  Neurological: He is oriented to person, place, and time.  Skin: Skin is warm.  Psychiatric: He has a normal mood and affect. His behavior is normal. Thought content normal.    BP (!) 154/76 (BP Location: Right Arm, Patient Position: Sitting, Cuff Size: Normal)   Pulse (!) 58   Temp (!) 96.8 F (36 C) (Temporal)   Ht 6' (1.829 m)   Wt 198 lb 3.2 oz (89.9 kg)   SpO2 97%   BMI 26.88 kg/m  Wt Readings from Last 3 Encounters:  02/27/19 198 lb 3.2 oz (89.9 kg)  12/01/18 200 lb (90.7 kg)  01/03/18 190 lb (86.2 kg)     There are no preventive care reminders to display for this patient.  There are no preventive care reminders to display for this patient.  Lab Results  Component Value Date   TSH 3.870 04/13/2017   Lab Results  Component Value Date   WBC 8.7 12/02/2018   HGB 15.9 12/02/2018   HCT 45.1 12/02/2018   MCV 90.2 12/02/2018   PLT 191 12/02/2018   Lab Results  Component Value Date   NA 138 12/02/2018   K 3.7 12/02/2018   CO2 24 12/02/2018   GLUCOSE 113 (H) 12/02/2018   BUN 8 12/02/2018   CREATININE 0.77 12/02/2018   BILITOT 1.0 12/02/2018   ALKPHOS 48 12/02/2018   AST 25 12/02/2018   ALT 19 12/02/2018   PROT 7.4 12/02/2018   ALBUMIN 4.3 12/02/2018   CALCIUM 9.6 12/02/2018   ANIONGAP 11 12/02/2018   Lab Results  Component Value Date   CHOL 139 11/16/2017   Lab Results  Component Value Date   HDL 49 11/16/2017   Lab Results  Component Value Date   LDLCALC 76 11/16/2017   Lab Results  Component Value Date   TRIG 68 11/16/2017   Lab Results  Component Value Date   CHOLHDL 2.8 11/16/2017   Lab Results  Component Value Date   HGBA1C 5.7 12/09/2016      Assessment & Plan:  Shuan was seen today for follow-up.  Diagnoses and all orders for  this visit:  Hypertension, unspecified type Elevated bp 154/76 uncontrolled  blood pressure goal </= 130/80, low-sodium, DASH diet, medication compliance, 150 minutes of moderate intensity exercise per week. Discussed medication compliance, adverse effects.  Tobacco dependence Made aware of increased risk for lung cancer and other respiratory diseases recommend cessation.  This will be reminded at each clinical  visit.    No orders of the defined types were placed in this encounter.   Follow-up: Return in about 6 weeks (around 04/10/2019) for Bp tele visit (patient to buy monitor).    Grayce SessionsMichelle P Mazell Aylesworth, NP

## 2019-02-27 NOTE — Patient Instructions (Signed)

## 2019-03-29 ENCOUNTER — Other Ambulatory Visit: Payer: Self-pay | Admitting: Primary Care

## 2019-03-29 DIAGNOSIS — I10 Essential (primary) hypertension: Secondary | ICD-10-CM

## 2019-03-29 DIAGNOSIS — E7841 Elevated Lipoprotein(a): Secondary | ICD-10-CM

## 2019-04-10 ENCOUNTER — Other Ambulatory Visit: Payer: Self-pay

## 2019-04-10 ENCOUNTER — Encounter (INDEPENDENT_AMBULATORY_CARE_PROVIDER_SITE_OTHER): Payer: Self-pay | Admitting: Primary Care

## 2019-04-10 ENCOUNTER — Ambulatory Visit (INDEPENDENT_AMBULATORY_CARE_PROVIDER_SITE_OTHER): Payer: Self-pay | Admitting: Primary Care

## 2019-04-10 VITALS — BP 158/81 | HR 69 | Temp 97.2°F | Ht 72.0 in | Wt 198.0 lb

## 2019-04-10 DIAGNOSIS — I10 Essential (primary) hypertension: Secondary | ICD-10-CM

## 2019-04-10 DIAGNOSIS — F17200 Nicotine dependence, unspecified, uncomplicated: Secondary | ICD-10-CM

## 2019-04-10 DIAGNOSIS — R7303 Prediabetes: Secondary | ICD-10-CM

## 2019-04-10 DIAGNOSIS — E7841 Elevated Lipoprotein(a): Secondary | ICD-10-CM

## 2019-04-10 LAB — POCT GLYCOSYLATED HEMOGLOBIN (HGB A1C): Hemoglobin A1C: 5.8 % — AB (ref 4.0–5.6)

## 2019-04-10 MED ORDER — AMLODIPINE BESYLATE 10 MG PO TABS: 10.0000 mg | ORAL_TABLET | Freq: Every day | ORAL | 1 refills | Status: DC

## 2019-04-10 MED ORDER — HYDROCHLOROTHIAZIDE 25 MG PO TABS: ORAL_TABLET | ORAL | 1 refills | Status: DC

## 2019-04-10 MED ORDER — ATORVASTATIN CALCIUM 40 MG PO TABS: 40.0000 mg | ORAL_TABLET | Freq: Every day | ORAL | 1 refills | Status: DC

## 2019-04-10 NOTE — Progress Notes (Signed)
Established Patient Office Visit  Subjective:  Patient ID: Thomas Mclean Thomas Aver., male    DOB: 11/30/1980  Age: 39 y.o. MRN: [id number]  CC: No chief complaint on file.   HPI Thomas Mclean Thomas Mclean. presents for the management of hypertension he is fasting this morning and did not take his Bp medication therefore not a true reading. Denies shortness of breath, headaches, chest pain or lower extremity edema. He is weaning off of cigarettes down to 1/2 pack a week prefer when he drinks and his alcohol consumption is down to once week.   Past Medical History:  Diagnosis Date  . Hypercholesteremia   . Hypertension 2014    No past surgical history on file.  Family History  Problem Relation Age of Onset  . Cancer Father   . Hypertension Maternal Grandmother   . Hypertension Maternal Grandfather     Social History   Socioeconomic History  . Marital status: Single    Spouse name: Not on file  . Number of children: Not on file  . Years of education: Not on file  . Highest education level: Not on file  Occupational History  . Not on file  Tobacco Use  . Smoking status: Current Every Day Smoker    Packs/day: 0.50    Types: Cigarettes    Start date: 02/28/1993  . Smokeless tobacco: Never Used  Substance and Sexual Activity  . Alcohol use: No    Alcohol/week: 0.0 standard drinks    Comment: EOD  . Drug use: No  . Sexual activity: Yes  Other Topics Concern  . Not on file  Social History Narrative  . Not on file   Social Determinants of Health   Financial Resource Strain:   . Difficulty of Paying Living Expenses: Not on file  Food Insecurity:   . Worried About Charity fundraiser in the Last Year: Not on file  . Ran Out of Food in the Last Year: Not on file  Transportation Needs:   . Lack of Transportation (Medical): Not on file  . Lack of Transportation (Non-Medical): Not on file  Physical Activity:   . Days of Exercise per Week: Not on file  . Minutes of Exercise  per Session: Not on file  Stress:   . Feeling of Stress : Not on file  Social Connections:   . Frequency of Communication with Friends and Family: Not on file  . Frequency of Social Gatherings with Friends and Family: Not on file  . Attends Religious Services: Not on file  . Active Member of Clubs or Organizations: Not on file  . Attends Archivist Meetings: Not on file  . Marital Status: Not on file  Intimate Partner Violence:   . Fear of Current or Ex-Partner: Not on file  . Emotionally Abused: Not on file  . Physically Abused: Not on file  . Sexually Abused: Not on file    Outpatient Medications Prior to Visit  Medication Sig Dispense Refill  . amLODipine (NORVASC) 10 MG tablet Take 1 tablet by mouth daily 90 tablet 0  . atorvastatin (LIPITOR) 40 MG tablet Take 1 tablet by mouth once daily 90 tablet 0  . hydrochlorothiazide (HYDRODIURIL) 25 MG tablet TAKE 1 TABLET BY MOUTH ONCE DAILY IN THE MORNING 90 tablet 0  . aspirin 81 MG EC tablet Take 1 tablet (81 mg total) by mouth daily. Swallow whole. (Patient not taking: Reported on 02/27/2019) 90 tablet 3  . senna-docusate (SENOKOT-S) 8.6-50 MG  tablet Take 1 tablet by mouth daily. (Patient not taking: Reported on 02/27/2019) 30 tablet 0   No facility-administered medications prior to visit.    No Known Allergies  ROS Review of Systems  All other systems reviewed and are negative.     Objective:    Physical Exam  Constitutional: He is oriented to person, place, and time. He appears well-developed and well-nourished.  HENT:  Head: Normocephalic.  Cardiovascular: Normal rate and regular rhythm.  Pulmonary/Chest: Effort normal.  Abdominal: Bowel sounds are normal.  Musculoskeletal:     Cervical back: Neck supple.  Neurological: He is oriented to person, place, and time. He has normal reflexes.  Skin: Skin is warm and dry.  Psychiatric: He has a normal mood and affect. His behavior is normal. Judgment and thought  content normal.    BP (!) 158/81 (BP Location: Right Arm, Patient Position: Sitting, Cuff Size: Normal)   Pulse 69   Temp (!) 97.2 F (36.2 C) (Temporal)   Ht 6' (1.829 m)   Wt 198 lb (89.8 kg)   SpO2 98%   BMI 26.85 kg/m  Wt Readings from Last 3 Encounters:  04/10/19 198 lb (89.8 kg)  02/27/19 198 lb 3.2 oz (89.9 kg)  12/01/18 200 lb (90.7 kg)     There are no preventive care reminders to display for this patient.  There are no preventive care reminders to display for this patient.  Lab Results  Component Value Date   TSH 3.870 04/13/2017   Lab Results  Component Value Date   WBC 8.7 12/02/2018   HGB 15.9 12/02/2018   HCT 45.1 12/02/2018   MCV 90.2 12/02/2018   PLT 191 12/02/2018   Lab Results  Component Value Date   NA 138 12/02/2018   K 3.7 12/02/2018   CO2 24 12/02/2018   GLUCOSE 113 (H) 12/02/2018   BUN 8 12/02/2018   CREATININE 0.77 12/02/2018   BILITOT 1.0 12/02/2018   ALKPHOS 48 12/02/2018   AST 25 12/02/2018   ALT 19 12/02/2018   PROT 7.4 12/02/2018   ALBUMIN 4.3 12/02/2018   CALCIUM 9.6 12/02/2018   ANIONGAP 11 12/02/2018   Lab Results  Component Value Date   CHOL 139 11/16/2017   Lab Results  Component Value Date   HDL 49 11/16/2017   Lab Results  Component Value Date   LDLCALC 76 11/16/2017   Lab Results  Component Value Date   TRIG 68 11/16/2017   Lab Results  Component Value Date   CHOLHDL 2.8 11/16/2017   Lab Results  Component Value Date   HGBA1C 5.8 (A) 04/10/2019      Assessment & Plan:  Diagnoses and all orders for this visit:  Prediabetes Remains prediabetic 5.8 continue to ADA recommends the following therapeutic goals for glycemic control related to A1c measurements: Reduction in foods that are high in carbohydrates are the following rice, potatoes, breads, sugars, and pastas.  Reduction in the intake (eating) will assist in lowering your blood sugars. -     HgB A1c -     Lipid Panel  Hypertension, unspecified  type Information provided with Bp guidelines when to call office or go to emergency room AVS. Bp elevated did not take medication prior to visit . Due to fasting. He has cut salt completely out of diet. Goal of blood pressure is less than 130/80, low-sodium, DASH diet, medication compliance, 150 minutes of moderate intensity exercise per week. Discussed medication compliance, adverse effects.  Tobacco dependence Aware of  increased risk for lung cancer and other respiratory diseases recommend cessation.  This will be reminded at each clinical visit. He is down to a 1/2 of pack of cigarettes per week.     No orders of the defined types were placed in this encounter.   Follow-up: No follow-ups on file.    Grayce Sessions, NP

## 2019-04-10 NOTE — Patient Instructions (Addendum)
Check blood pressure daily at the same time. Keep a log of all blood pressure readings.  DASH DIET; No salt or low sodium diet  If pressure if greater than 150/100 notify me here at the office.  If it's persistently greater 180/90, go to the Emergency Department.  Take all medication as prescribed.   Avoid smoked meats which are high in sodium content.  Avoid soda which contains sodium and are high in sugar which increases your risk for diabetes. Recommendations For Diabetic/Prediabetic Patients:  - Exercise at least 5 times a week for 30 minutes or preferably daily.  - No Smoking - Drink less than 2 drinks a day.  - Monitor your feet for sores - Have yearly Eye Exams - Recommend annual Flu vaccine  - Recommend Pneumovax and Prevnar vaccines - Shingles Vaccine (Zostavax) if over 80 y.o. Goals:  - BMI less than 24 - Fasting sugar less than 130 or less than 150 if tapering medicines to lose weight  - Systolic BP less than 130  - Diastolic BP less than 80 - Bad LDL Cholesterol less than 70 - Triglycerides less than 150

## 2019-04-11 LAB — LIPID PANEL
Chol/HDL Ratio: 2.8 ratio (ref 0.0–5.0)
Cholesterol, Total: 138 mg/dL (ref 100–199)
HDL: 50 mg/dL (ref >39)
LDL Chol Calc (NIH): 71 mg/dL (ref 0–99)
Triglycerides: 87 mg/dL (ref 0–149)
VLDL Cholesterol Cal: 17 mg/dL (ref 5–40)

## 2019-04-12 ENCOUNTER — Other Ambulatory Visit (INDEPENDENT_AMBULATORY_CARE_PROVIDER_SITE_OTHER): Payer: Self-pay | Admitting: Primary Care

## 2019-04-12 DIAGNOSIS — E7841 Elevated Lipoprotein(a): Secondary | ICD-10-CM

## 2019-04-12 MED ORDER — ATORVASTATIN CALCIUM 20 MG PO TABS: 20.0000 mg | ORAL_TABLET | Freq: Every day | ORAL | 1 refills | Status: DC

## 2019-07-08 ENCOUNTER — Encounter (INDEPENDENT_AMBULATORY_CARE_PROVIDER_SITE_OTHER): Payer: Self-pay | Admitting: Primary Care

## 2019-07-08 ENCOUNTER — Other Ambulatory Visit: Payer: Self-pay

## 2019-07-08 ENCOUNTER — Encounter (INDEPENDENT_AMBULATORY_CARE_PROVIDER_SITE_OTHER): Payer: Medicaid Other | Admitting: Primary Care

## 2019-07-08 DIAGNOSIS — E7841 Elevated Lipoprotein(a): Secondary | ICD-10-CM

## 2019-07-08 DIAGNOSIS — I10 Essential (primary) hypertension: Secondary | ICD-10-CM

## 2019-07-08 MED ORDER — ATORVASTATIN CALCIUM 20 MG PO TABS: 20.0000 mg | ORAL_TABLET | Freq: Every day | ORAL | 1 refills | Status: DC

## 2019-07-08 MED ORDER — AMLODIPINE BESYLATE 10 MG PO TABS: 10.0000 mg | ORAL_TABLET | Freq: Every day | ORAL | 1 refills | Status: DC

## 2019-07-08 MED ORDER — HYDROCHLOROTHIAZIDE 25 MG PO TABS: ORAL_TABLET | ORAL | 1 refills | Status: DC

## 2019-07-08 NOTE — Progress Notes (Signed)
F/u HTN unble to take BP  2 mos ago BP was 140/79  Out of medication since Friday. Needs RF

## 2019-07-18 ENCOUNTER — Other Ambulatory Visit: Payer: Self-pay | Admitting: Primary Care

## 2019-07-18 ENCOUNTER — Ambulatory Visit (INDEPENDENT_AMBULATORY_CARE_PROVIDER_SITE_OTHER): Payer: Self-pay | Admitting: Primary Care

## 2019-07-18 ENCOUNTER — Encounter (INDEPENDENT_AMBULATORY_CARE_PROVIDER_SITE_OTHER): Payer: Self-pay | Admitting: Primary Care

## 2019-07-18 ENCOUNTER — Other Ambulatory Visit: Payer: Self-pay

## 2019-07-18 VITALS — BP 156/83 | HR 72 | Temp 97.3°F | Ht 72.0 in | Wt 192.0 lb

## 2019-07-18 DIAGNOSIS — Z0130 Encounter for examination of blood pressure without abnormal findings: Secondary | ICD-10-CM

## 2019-07-18 DIAGNOSIS — I10 Essential (primary) hypertension: Secondary | ICD-10-CM

## 2019-07-18 DIAGNOSIS — E7841 Elevated Lipoprotein(a): Secondary | ICD-10-CM

## 2019-07-18 MED ORDER — HYDROCHLOROTHIAZIDE 25 MG PO TABS: ORAL_TABLET | ORAL | 1 refills | Status: DC

## 2019-07-18 MED ORDER — LOSARTAN POTASSIUM 50 MG PO TABS
50.0000 mg | ORAL_TABLET | Freq: Every day | ORAL | 3 refills | Status: DC
Start: 2019-07-18 — End: 2020-01-14

## 2019-07-18 MED ORDER — ATORVASTATIN CALCIUM 20 MG PO TABS: 20.0000 mg | ORAL_TABLET | Freq: Every day | ORAL | 1 refills | Status: DC

## 2019-07-18 MED ORDER — AMLODIPINE BESYLATE 10 MG PO TABS: 10.0000 mg | ORAL_TABLET | Freq: Every day | ORAL | 1 refills | Status: DC

## 2019-07-18 MED FILL — ATORVASTATIN CALCIUM 20 MG: 20 | 30 days supply | Qty: 30 | Fill #0

## 2019-07-18 MED FILL — LOSARTAN POTASSIUM 50 MG TA: 50 | 30 days supply | Qty: 30 | Fill #0

## 2019-07-18 MED FILL — AMLODIPINE BESYLATE 10 MG T: 10 | 30 days supply | Qty: 30 | Fill #0

## 2019-07-18 MED FILL — HYDROCHLOROTHIAZIDE 25 MG T: 25 | 30 days supply | Qty: 30 | Fill #0

## 2019-07-18 NOTE — Patient Instructions (Signed)
   Managing Your Hypertension Hypertension is commonly called high blood pressure. This is when the force of your blood pressing against the walls of your arteries is too strong. Arteries are blood vessels that carry blood from your heart throughout your body. Hypertension forces the heart to work harder to pump blood, and may cause the arteries to become narrow or stiff. Having untreated or uncontrolled hypertension can cause heart attack, stroke, kidney disease, and other problems. What are blood pressure readings? A blood pressure reading consists of a higher number over a lower number. Ideally, your blood pressure should be below 120/80. The first ("top") number is called the systolic pressure. It is a measure of the pressure in your arteries as your heart beats. The second ("bottom") number is called the diastolic pressure. It is a measure of the pressure in your arteries as the heart relaxes. What does my blood pressure reading mean? Blood pressure is classified into four stages. Based on your blood pressure reading, your health care provider may use the following stages to determine what type of treatment you need, if any. Systolic pressure and diastolic pressure are measured in a unit called mm Hg. Normal  Systolic pressure: below 120.  Diastolic pressure: below 80. Elevated  Systolic pressure: 120-129.  Diastolic pressure: below 80. Hypertension stage 1  Systolic pressure: 130-139.  Diastolic pressure: 80-89. Hypertension stage 2  Systolic pressure: 140 or above.  Diastolic pressure: 90 or above. What health risks are associated with hypertension? Managing your hypertension is an important responsibility. Uncontrolled hypertension can lead to:  A heart attack.  A stroke.  A weakened blood vessel (aneurysm).  Heart failure.  Kidney damage.  Eye damage.  Metabolic syndrome.  Memory and concentration problems. What changes can I make to manage my  hypertension? Hypertension can be managed by making lifestyle changes and possibly by taking medicines. Your health care provider will help you make a plan to bring your blood pressure within a normal range. Eating and drinking   Eat a diet that is high in fiber and potassium, and low in salt (sodium), added sugar, and fat. An example eating plan is called the DASH (Dietary Approaches to Stop Hypertension) diet. To eat this way: ? Eat plenty of fresh fruits and vegetables. Try to fill half of your plate at each meal with fruits and vegetables. ? Eat whole grains, such as whole wheat pasta, brown rice, or whole grain bread. Fill about one quarter of your plate with whole grains. ? Eat low-fat diary products. ? Avoid fatty cuts of meat, processed or cured meats, and poultry with skin. Fill about one quarter of your plate with lean proteins such as fish, chicken without skin, beans, eggs, and tofu. ? Avoid premade and processed foods. These tend to be higher in sodium, added sugar, and fat.  Reduce your daily sodium intake. Most people with hypertension should eat less than 1,500 mg of sodium a day.  Limit alcohol intake to no more than 1 drink a day for nonpregnant women and 2 drinks a day for men. One drink equals 12 oz of beer, 5 oz of wine, or 1 oz of hard liquor. Lifestyle  Work with your health care provider to maintain a healthy body weight, or to lose weight. Ask what an ideal weight is for you.  Get at least 30 minutes of exercise that causes your heart to beat faster (aerobic exercise) most days of the week. Activities may include walking, swimming, or biking.    Include exercise to strengthen your muscles (resistance exercise), such as weight lifting, as part of your weekly exercise routine. Try to do these types of exercises for 30 minutes at least 3 days a week.  Do not use any products that contain nicotine or tobacco, such as cigarettes and e-cigarettes. If you need help quitting,  ask your health care provider.  Control any long-term (chronic) conditions you have, such as high cholesterol or diabetes. Monitoring  Monitor your blood pressure at home as told by your health care provider. Your personal target blood pressure may vary depending on your medical conditions, your age, and other factors.  Have your blood pressure checked regularly, as often as told by your health care provider. Working with your health care provider  Review all the medicines you take with your health care provider because there may be side effects or interactions.  Talk with your health care provider about your diet, exercise habits, and other lifestyle factors that may be contributing to hypertension.  Visit your health care provider regularly. Your health care provider can help you create and adjust your plan for managing hypertension. Will I need medicine to control my blood pressure? Your health care provider may prescribe medicine if lifestyle changes are not enough to get your blood pressure under control, and if:  Your systolic blood pressure is 130 or higher.  Your diastolic blood pressure is 80 or higher. Take medicines only as told by your health care provider. Follow the directions carefully. Blood pressure medicines must be taken as prescribed. The medicine does not work as well when you skip doses. Skipping doses also puts you at risk for problems. Contact a health care provider if:  You think you are having a reaction to medicines you have taken.  You have repeated (recurrent) headaches.  You feel dizzy.  You have swelling in your ankles.  You have trouble with your vision. Get help right away if:  You develop a severe headache or confusion.  You have unusual weakness or numbness, or you feel faint.  You have severe pain in your chest or abdomen.  You vomit repeatedly.  You have trouble breathing. Summary  Hypertension is when the force of blood pumping  through your arteries is too strong. If this condition is not controlled, it may put you at risk for serious complications.  Your personal target blood pressure may vary depending on your medical conditions, your age, and other factors. For most people, a normal blood pressure is less than 120/80.  Hypertension is managed by lifestyle changes, medicines, or both. Lifestyle changes include weight loss, eating a healthy, low-sodium diet, exercising more, and limiting alcohol. This information is not intended to replace advice given to you by your health care provider. Make sure you discuss any questions you have with your health care provider. Document Revised: 06/08/2018 Document Reviewed: 01/13/2016 Elsevier Patient Education  2020 [employer]  

## 2019-07-18 NOTE — Progress Notes (Signed)
Presents for  hypertension evaluation, on previous visit medication was refilled he was out of Bp medication for over a week. Refilled.Denies shortness of breath, headaches, chest pain or lower extremity edema, sudden onset, vision changes, unilateral weakness, dizziness, paresthesias  Patient reports adherence with medications.  Current Medication List Current Outpatient Medications on File Prior to Visit  Medication Sig Dispense Refill  . amLODipine (NORVASC) 10 MG tablet Take 1 tablet (10 mg total) by mouth daily. 90 tablet 1  . atorvastatin (LIPITOR) 20 MG tablet Take 1 tablet (20 mg total) by mouth daily. 90 tablet 1  . hydrochlorothiazide (HYDRODIURIL) 25 MG tablet TAKE 1 TABLET BY MOUTH ONCE DAILY IN THE MORNING 90 tablet 1  . aspirin 81 MG EC tablet Take 1 tablet (81 mg total) by mouth daily. Swallow whole. (Patient not taking: Reported on 07/18/2019) 90 tablet 3   No current facility-administered medications on file prior to visit.   Past Medical History  Past Medical History:  Diagnosis Date  . Hypercholesteremia   . Hypertension 2014   Dietary habits include: low sodium diet reading labels to determine sodium content stop eating pork Exercise habits include:none but active all day  Family / Social history: None BP (!) 156/83 (BP Location: Right Arm, Patient Position: Sitting, Cuff Size: Normal)   Pulse 72   Temp (!) 97.3 F (36.3 C) (Temporal)   Ht 6' (1.829 m)   Wt 192 lb (87.1 kg)   SpO2 97%   BMI 26.04 kg/m  O:  Physical Exam Constitutional:      Appearance: Normal appearance.  HENT:     Head: Normocephalic.  Cardiovascular:     Rate and Rhythm: Normal rate and regular rhythm.  Pulmonary:     Effort: Pulmonary effort is normal.     Breath sounds: Normal breath sounds.  Abdominal:     General: Bowel sounds are normal.  Musculoskeletal:        General: Normal range of motion.     Cervical back: Normal range of motion.  Skin:    General: Skin is warm.   Neurological:     Mental Status: He is alert and oriented to person, place, and time.  Psychiatric:        Mood and Affect: Mood normal.        Behavior: Behavior normal.        Thought Content: Thought content normal.        Judgment: Judgment normal.      Review of Systems  All other systems reviewed and are negative.   Last 3 Office BP readings: BP Readings from Last 3 Encounters:  07/18/19 (!) 156/83  04/10/19 (!) 158/81  02/27/19 (!) 154/76    BMET    Component Value Date/Time   NA 138 12/02/2018 0846   NA 137 11/16/2017 0900   K 3.7 12/02/2018 0846   CL 103 12/02/2018 0846   CO2 24 12/02/2018 0846   GLUCOSE 113 (H) 12/02/2018 0846   BUN 8 12/02/2018 0846   BUN 10 11/16/2017 0900   CREATININE 0.77 12/02/2018 0846   CALCIUM 9.6 12/02/2018 0846   GFRNONAA >60 12/02/2018 0846   GFRAA >60 12/02/2018 0846    Renal function: CrCl cannot be calculated (Patient's most recent lab result is older than the maximum 21 days allowed.).  Clinical ASCVD: Yes  The 10-year ASCVD risk score Denman George DC Jr., et al., 2013) is: 2.9%   Values used to calculate the score:     Age: 39  years     Sex: Male     Is Non-Hispanic African American: No     Diabetic: No     Tobacco smoker: Yes     Systolic Blood Pressure: 518 mmHg     Is BP treated: Yes     HDL Cholesterol: 50 mg/dL     Total Cholesterol: 138 mg/dL   A/P: Blood pressure check Hypertension longstanding currently amlodipine 10mg  and HCTZ 25mg  daily on current medications. BP Goal = 130/80 mmHg. Patient is adherent with current medications. -Added Losartan 50 mg take in AM with amlodipine 10mg , and HCTZ 25mg  -F/u labs ordered - none  -Counseled on lifestyle modifications for blood pressure control including reduced dietary sodium, increased exercise, adequate sleep

## 2019-08-13 MED FILL — AMLODIPINE BESYLATE 10 MG T: 10 | 30 days supply | Qty: 30 | Fill #0

## 2019-08-13 MED FILL — ATORVASTATIN CALCIUM 20 MG: 20 | 30 days supply | Qty: 30 | Fill #0

## 2019-08-13 MED FILL — HYDROCHLOROTHIAZIDE 25 MG T: 25 | 30 days supply | Qty: 30 | Fill #0

## 2019-08-23 MED FILL — LOSARTAN POTASSIUM 50 MG TA: 50 | 30 days supply | Qty: 30 | Fill #1

## 2019-09-09 ENCOUNTER — Ambulatory Visit (INDEPENDENT_AMBULATORY_CARE_PROVIDER_SITE_OTHER): Payer: Medicaid Other | Admitting: Primary Care

## 2019-09-16 MED FILL — HYDROCHLOROTHIAZIDE 25 MG T: 25 | 30 days supply | Qty: 30 | Fill #1

## 2019-09-16 MED FILL — AMLODIPINE BESYLATE 10 MG T: 10 | 30 days supply | Qty: 30 | Fill #1

## 2019-09-16 MED FILL — ATORVASTATIN CALCIUM 20 MG: 20 | 30 days supply | Qty: 30 | Fill #1

## 2019-09-18 ENCOUNTER — Other Ambulatory Visit: Payer: Self-pay

## 2019-09-18 ENCOUNTER — Ambulatory Visit (INDEPENDENT_AMBULATORY_CARE_PROVIDER_SITE_OTHER): Payer: Self-pay | Admitting: Primary Care

## 2019-09-18 ENCOUNTER — Encounter (INDEPENDENT_AMBULATORY_CARE_PROVIDER_SITE_OTHER): Payer: Self-pay | Admitting: Primary Care

## 2019-09-18 VITALS — BP 124/68 | HR 63 | Ht 72.0 in | Wt 199.0 lb

## 2019-09-18 DIAGNOSIS — R7303 Prediabetes: Secondary | ICD-10-CM

## 2019-09-18 DIAGNOSIS — I10 Essential (primary) hypertension: Secondary | ICD-10-CM

## 2019-09-27 MED FILL — LOSARTAN POTASSIUM 50 MG TA: 50 | 30 days supply | Qty: 30 | Fill #2

## 2019-09-29 NOTE — Progress Notes (Signed)
 Established Patient Office Visit  Subjective:  Patient ID: Thomas Mclean., male    DOB: 09/17/1980  Age: 39 y.o. MRN: [id number]  CC:  Chief Complaint  Patient presents with  . Blood Pressure Check    Pt. is here for blood pressure check.     HPI Thomas Mclean. is a 39-year-old male who presents for hypertension management and for follow-up on blood pressure on medications.  Today his blood pressure is well controlled 124/64.  He denies shortness of breath, headaches, chest pain or lower extremity edema  Past Medical History:  Diagnosis Date  . Hypercholesteremia   . Hypertension 2014    History reviewed. No pertinent surgical history.  Family History  Problem Relation Age of Onset  . Cancer Father   . Hypertension Maternal Grandmother   . Hypertension Maternal Grandfather     Social History   Socioeconomic History  . Marital status: Single    Spouse name: Not on file  . Number of children: Not on file  . Years of education: Not on file  . Highest education level: Not on file  Occupational History  . Not on file  Tobacco Use  . Smoking status: Current Every Day Smoker    Packs/day: 0.50    Types: Cigarettes    Start date: 02/28/1993  . Smokeless tobacco: Never Used  Substance and Sexual Activity  . Alcohol use: No    Alcohol/week: 0.0 standard drinks    Comment: EOD  . Drug use: No  . Sexual activity: Yes  Other Topics Concern  . Not on file  Social History Narrative  . Not on file   Social Determinants of Health   Financial Resource Strain:   . Difficulty of Paying Living Expenses:   Food Insecurity:   . Worried About Running Out of Food in the Last Year:   . Ran Out of Food in the Last Year:   Transportation Needs:   . Lack of Transportation (Medical):   . Lack of Transportation (Non-Medical):   Physical Activity:   . Days of Exercise per Week:   . Minutes of Exercise per Session:   Stress:   . Feeling of Stress :   Social  Connections:   . Frequency of Communication with Friends and Family:   . Frequency of Social Gatherings with Friends and Family:   . Attends Religious Services:   . Active Member of Clubs or Organizations:   . Attends Club or Organization Meetings:   . Marital Status:   Intimate Partner Violence:   . Fear of Current or Ex-Partner:   . Emotionally Abused:   . Physically Abused:   . Sexually Abused:     Outpatient Medications Prior to Visit  Medication Sig Dispense Refill  . amLODipine (NORVASC) 10 MG tablet Take 1 tablet (10 mg total) by mouth daily. 90 tablet 1  . atorvastatin (LIPITOR) 20 MG tablet Take 1 tablet (20 mg total) by mouth daily. 90 tablet 1  . hydrochlorothiazide (HYDRODIURIL) 25 MG tablet TAKE 1 TABLET BY MOUTH ONCE DAILY IN THE MORNING 90 tablet 1  . losartan (COZAAR) 50 MG tablet Take 1 tablet (50 mg total) by mouth daily. 90 tablet 3  . aspirin 81 MG EC tablet Take 1 tablet (81 mg total) by mouth daily. Swallow whole. (Patient not taking: Reported on 07/18/2019) 90 tablet 3   No facility-administered medications prior to visit.    No Known Allergies  ROS Review of Systems    All other systems reviewed and are negative.     Objective:    Physical Exam Vitals reviewed.  HENT:     Head: Normocephalic.  Cardiovascular:     Rate and Rhythm: Normal rate and regular rhythm.     Pulses: Normal pulses.     Heart sounds: Normal heart sounds.  Pulmonary:     Effort: Pulmonary effort is normal.     Breath sounds: Normal breath sounds.  Abdominal:     General: Bowel sounds are normal. There is distension.  Musculoskeletal:     Cervical back: Normal range of motion.  Skin:    General: Skin is warm and dry.  Neurological:     General: No focal deficit present.     Mental Status: He is alert.  Psychiatric:        Mood and Affect: Mood normal.        Behavior: Behavior normal.        Thought Content: Thought content normal.        Judgment: Judgment normal.      BP 124/68 (BP Location: Right Arm, Patient Position: Sitting, Cuff Size: Normal)   Pulse 63   Ht 6' (1.829 m)   Wt 199 lb (90.3 kg)   SpO2 99%   BMI 26.99 kg/m  Wt Readings from Last 3 Encounters:  09/18/19 199 lb (90.3 kg)  07/18/19 192 lb (87.1 kg)  04/10/19 198 lb (89.8 kg)     Health Maintenance Due  Topic Date Due  . Hepatitis C Screening  Never done  . COVID-19 Vaccine (1) Never done  . INFLUENZA VACCINE  09/29/2019    There are no preventive care reminders to display for this patient.  Lab Results  Component Value Date   TSH 3.870 04/13/2017   Lab Results  Component Value Date   WBC 8.7 12/02/2018   HGB 15.9 12/02/2018   HCT 45.1 12/02/2018   MCV 90.2 12/02/2018   PLT 191 12/02/2018   Lab Results  Component Value Date   NA 138 12/02/2018   K 3.7 12/02/2018   CO2 24 12/02/2018   GLUCOSE 113 (H) 12/02/2018   BUN 8 12/02/2018   CREATININE 0.77 12/02/2018   BILITOT 1.0 12/02/2018   ALKPHOS 48 12/02/2018   AST 25 12/02/2018   ALT 19 12/02/2018   PROT 7.4 12/02/2018   ALBUMIN 4.3 12/02/2018   CALCIUM 9.6 12/02/2018   ANIONGAP 11 12/02/2018   Lab Results  Component Value Date   CHOL 138 04/10/2019   Lab Results  Component Value Date   HDL 50 04/10/2019   Lab Results  Component Value Date   LDLCALC 71 04/10/2019   Lab Results  Component Value Date   TRIG 87 04/10/2019   Lab Results  Component Value Date   CHOLHDL 2.8 04/10/2019   Lab Results  Component Value Date   HGBA1C 5.8 (A) 04/10/2019      Assessment & Plan:  Thomas Mclean was seen today for blood pressure check.  Diagnoses and all orders for this visit:  Prediabetes -     Glucose (CBG)-5.8 04/10/2019  Managed by lifestyle modifications   Essential hypertension We have discussed target BP range and blood pressure goal.  Goal is met less than 130/80 today 124/68.  Unable to check blood pressure at home   We discussed the importance of compliance with medical therapy and DASH  diet recommended, consequences of uncontrolled hypertension discussed.  - continue current BP medications    No orders  of the defined types were placed in this encounter.   Follow-up: Return in about 6 months (around 03/20/2020) for in person ck A1C and Bp.    Kerin Perna, NP

## 2019-10-16 ENCOUNTER — Ambulatory Visit (INDEPENDENT_AMBULATORY_CARE_PROVIDER_SITE_OTHER): Payer: Medicaid Other | Admitting: Primary Care

## 2019-10-22 MED FILL — ATORVASTATIN CALCIUM 20 MG: 20 | 30 days supply | Qty: 30 | Fill #2

## 2019-10-22 MED FILL — HYDROCHLOROTHIAZIDE 25 MG T: 25 | 30 days supply | Qty: 30 | Fill #2

## 2019-10-22 MED FILL — AMLODIPINE BESYLATE 10 MG T: 10 | 30 days supply | Qty: 30 | Fill #2

## 2019-11-01 MED FILL — LOSARTAN POTASSIUM 50 MG TA: 50 | 30 days supply | Qty: 30 | Fill #3

## 2019-12-03 MED FILL — AMLODIPINE BESYLATE 10 MG T: 10 | 30 days supply | Qty: 30 | Fill #3

## 2019-12-03 MED FILL — HYDROCHLOROTHIAZIDE 25 MG T: 25 | 30 days supply | Qty: 30 | Fill #3

## 2019-12-03 MED FILL — ATORVASTATIN CALCIUM 20 MG: 20 | 30 days supply | Qty: 30 | Fill #3

## 2019-12-11 ENCOUNTER — Ambulatory Visit (INDEPENDENT_AMBULATORY_CARE_PROVIDER_SITE_OTHER): Payer: Medicaid Other | Admitting: Primary Care

## 2019-12-12 MED FILL — LOSARTAN POTASSIUM 50 MG TA: 50 | 30 days supply | Qty: 30 | Fill #4

## 2019-12-19 ENCOUNTER — Ambulatory Visit (INDEPENDENT_AMBULATORY_CARE_PROVIDER_SITE_OTHER): Payer: Medicaid Other | Admitting: Primary Care

## 2020-01-07 ENCOUNTER — Ambulatory Visit (INDEPENDENT_AMBULATORY_CARE_PROVIDER_SITE_OTHER): Payer: Medicaid Other | Admitting: Primary Care

## 2020-01-09 MED FILL — AMLODIPINE BESYLATE 10 MG T: 10 | 30 days supply | Qty: 30 | Fill #4

## 2020-01-09 MED FILL — HYDROCHLOROTHIAZIDE 25 MG T: 25 | 30 days supply | Qty: 30 | Fill #4

## 2020-01-09 MED FILL — ATORVASTATIN CALCIUM 20 MG: 20 | 30 days supply | Qty: 30 | Fill #4

## 2020-01-14 ENCOUNTER — Other Ambulatory Visit: Payer: Self-pay | Admitting: Primary Care

## 2020-01-14 ENCOUNTER — Encounter (INDEPENDENT_AMBULATORY_CARE_PROVIDER_SITE_OTHER): Payer: Self-pay | Admitting: Primary Care

## 2020-01-14 ENCOUNTER — Other Ambulatory Visit: Payer: Self-pay

## 2020-01-14 ENCOUNTER — Ambulatory Visit (INDEPENDENT_AMBULATORY_CARE_PROVIDER_SITE_OTHER): Payer: Self-pay | Admitting: Primary Care

## 2020-01-14 VITALS — BP 115/66 | HR 61 | Temp 97.7°F | Ht 72.0 in | Wt 198.8 lb

## 2020-01-14 DIAGNOSIS — R7303 Prediabetes: Secondary | ICD-10-CM

## 2020-01-14 DIAGNOSIS — I10 Essential (primary) hypertension: Secondary | ICD-10-CM

## 2020-01-14 DIAGNOSIS — F17200 Nicotine dependence, unspecified, uncomplicated: Secondary | ICD-10-CM

## 2020-01-14 LAB — POCT GLYCOSYLATED HEMOGLOBIN (HGB A1C): Hemoglobin A1C: 5.7 % — AB (ref 4.0–5.6)

## 2020-01-14 MED ORDER — HYDROCHLOROTHIAZIDE 25 MG PO TABS: ORAL_TABLET | ORAL | 1 refills | Status: DC

## 2020-01-14 MED ORDER — LOSARTAN POTASSIUM 50 MG PO TABS: 50.0000 mg | ORAL_TABLET | Freq: Every day | ORAL | 3 refills | Status: DC

## 2020-01-14 MED ORDER — AMLODIPINE BESYLATE 10 MG PO TABS: 10.0000 mg | ORAL_TABLET | Freq: Every day | ORAL | 1 refills | Status: DC

## 2020-01-14 MED ORDER — ATORVASTATIN CALCIUM 10 MG PO TABS: 10.0000 mg | ORAL_TABLET | Freq: Every day | ORAL | 3 refills | Status: DC

## 2020-01-14 MED ORDER — LOSARTAN POTASSIUM 25 MG PO TABS: 25.0000 mg | ORAL_TABLET | Freq: Every day | ORAL | 1 refills | Status: DC

## 2020-01-14 MED FILL — LOSARTAN POTASSIUM 50 MG TA: 50 | 30 days supply | Qty: 30 | Fill #0

## 2020-01-14 NOTE — Progress Notes (Signed)
Established Patient Office Visit  Subjective:  Patient ID: Thomas Mccollam Binnie Kand., male    DOB: 1980-05-18  Age: 39 y.o. MRN: [id number]  CC:  Chief Complaint  Patient presents with   Prediabetes   Hypertension    HPI Mr. Thomas Mclean. is a 39 year old male who presents for hypertension management. He denies shortness of breath, headaches, chest pain or lower extremity edema.  Blood pressure is unremarkable.Prediabetes not managed on medication he denies increase in polyuria, polydipsia and polyphagia  Past Medical History:  Diagnosis Date   Hypercholesteremia    Hypertension 2014    No past surgical history on file.  Family History  Problem Relation Age of Onset   Cancer Father    Hypertension Maternal Grandmother    Hypertension Maternal Grandfather     Social History   Socioeconomic History   Marital status: Single    Spouse name: Not on file   Number of children: Not on file   Years of education: Not on file   Highest education level: Not on file  Occupational History   Not on file  Tobacco Use   Smoking status: Current Every Day Smoker    Packs/day: 0.50    Types: Cigarettes    Start date: 02/28/1993   Smokeless tobacco: Never Used  Substance and Sexual Activity   Alcohol use: No    Alcohol/week: 0.0 standard drinks    Comment: EOD   Drug use: No   Sexual activity: Yes  Other Topics Concern   Not on file  Social History Narrative   Not on file   Social Determinants of Health   Financial Resource Strain:    Difficulty of Paying Living Expenses: Not on file  Food Insecurity:    Worried About Programme researcher, broadcasting/film/video in the Last Year: Not on file   [employer] of Food in the Last Year: Not on file  Transportation Needs:    Lack of Transportation (Medical): Not on file   Lack of Transportation (Non-Medical): Not on file  Physical Activity:    Days of Exercise per Week: Not on file   Minutes of Exercise per Session: Not on  file  Stress:    Feeling of Stress : Not on file  Social Connections:    Frequency of Communication with Friends and Family: Not on file   Frequency of Social Gatherings with Friends and Family: Not on file   Attends Religious Services: Not on file   Active Member of Clubs or Organizations: Not on file   Attends Banker Meetings: Not on file   Marital Status: Not on file  Intimate Partner Violence:    Fear of Current or Ex-Partner: Not on file   Emotionally Abused: Not on file   Physically Abused: Not on file   Sexually Abused: Not on file    Outpatient Medications Prior to Visit  Medication Sig Dispense Refill   amLODipine (NORVASC) 10 MG tablet Take 1 tablet (10 mg total) by mouth daily. 90 tablet 1   atorvastatin (LIPITOR) 20 MG tablet Take 1 tablet (20 mg total) by mouth daily. 90 tablet 1   hydrochlorothiazide (HYDRODIURIL) 25 MG tablet TAKE 1 TABLET BY MOUTH ONCE DAILY IN THE MORNING 90 tablet 1   losartan (COZAAR) 50 MG tablet Take 1 tablet (50 mg total) by mouth daily. 90 tablet 3   aspirin 81 MG EC tablet Take 1 tablet (81 mg total) by mouth daily. Swallow whole. (Patient not taking:  Reported on 07/18/2019) 90 tablet 3   No facility-administered medications prior to visit.    No Known Allergies  ROS Review of Systems  All other systems reviewed and are negative.     Objective:    Physical Exam Vitals reviewed.  Constitutional:      Appearance: Normal appearance. He is normal weight.  HENT:     Head: Normocephalic.     Right Ear: Tympanic membrane normal.     Left Ear: Tympanic membrane normal.     Nose: Nose normal.  Eyes:     Extraocular Movements: Extraocular movements intact.     Pupils: Pupils are equal, round, and reactive to light.  Cardiovascular:     Rate and Rhythm: Normal rate and regular rhythm.  Pulmonary:     Effort: Pulmonary effort is normal.     Breath sounds: Normal breath sounds.  Abdominal:     General:  Bowel sounds are normal.     Palpations: Abdomen is soft.  Musculoskeletal:        General: Normal range of motion.     Cervical back: Normal range of motion.  Skin:    General: Skin is warm and dry.  Neurological:     Mental Status: He is alert and oriented to person, place, and time.  Psychiatric:        Mood and Affect: Mood normal.        Behavior: Behavior normal.        Thought Content: Thought content normal.        Judgment: Judgment normal.     BP 115/66 (BP Location: Left Arm, Patient Position: Sitting, Cuff Size: Normal)    Pulse 61    Temp 97.7 F (36.5 C) (Temporal)    Ht 6' (1.829 m)    Wt 198 lb 12.8 oz (90.2 kg)    SpO2 96%    BMI 26.96 kg/m  Wt Readings from Last 3 Encounters:  01/14/20 198 lb 12.8 oz (90.2 kg)  09/18/19 199 lb (90.3 kg)  07/18/19 192 lb (87.1 kg)     Health Maintenance Due  Topic Date Due   COVID-19 Vaccine (1) Never done    There are no preventive care reminders to display for this patient.  Lab Results  Component Value Date   TSH 3.870 04/13/2017   Lab Results  Component Value Date   WBC 8.7 12/02/2018   HGB 15.9 12/02/2018   HCT 45.1 12/02/2018   MCV 90.2 12/02/2018   PLT 191 12/02/2018   Lab Results  Component Value Date   NA 138 12/02/2018   K 3.7 12/02/2018   CO2 24 12/02/2018   GLUCOSE 113 (H) 12/02/2018   BUN 8 12/02/2018   CREATININE 0.77 12/02/2018   BILITOT 1.0 12/02/2018   ALKPHOS 48 12/02/2018   AST 25 12/02/2018   ALT 19 12/02/2018   PROT 7.4 12/02/2018   ALBUMIN 4.3 12/02/2018   CALCIUM 9.6 12/02/2018   ANIONGAP 11 12/02/2018   Lab Results  Component Value Date   CHOL 138 04/10/2019   Lab Results  Component Value Date   HDL 50 04/10/2019   Lab Results  Component Value Date   LDLCALC 71 04/10/2019   Lab Results  Component Value Date   TRIG 87 04/10/2019   Lab Results  Component Value Date   CHOLHDL 2.8 04/10/2019   Lab Results  Component Value Date   HGBA1C 5.7 (A) 01/14/2020       Assessment & Plan:  Thomas Mclean  was seen today for prediabetes and hypertension.  Diagnoses and all orders for this visit:  Prediabetes -     HgB A1c 5.7  Monitor carbohydrates in diet and start a routine exercise plan daily  -     atorvastatin (LIPITOR) 10 MG tablet; Take 1 tablet (10 mg total) by mouth daily.  Hypertension, unspecified type Well controlled  At goal on HCTZ 25mg , amlodipine 10mg  and Cozaar 50( reduce Cozaar to 25mg  daily and continue to monitor  -     hydrochlorothiazide (HYDRODIURIL) 25 MG tablet; TAKE 1 TABLET BY MOUTH ONCE DAILY IN THE MORNING -     amLODipine (NORVASC) 10 MG tablet; Take 1 tablet (10 mg total) by mouth daily.  Tobacco dependence He is aware of increased risk for lung cancer and other respiratory diseases . Causal smoker depending on what he is doing.  This will be reminded at each clinical visit. recommend cessation.   Other orders -     losartan (COZAAR) 50 MG tablet; Take 1 tablet (50 mg total) by mouth daily.     Follow-up: Return in about 6 months (around 07/13/2020) for Prediabetes and BP .    , NP

## 2020-01-14 NOTE — Patient Instructions (Signed)

## 2020-01-20 ENCOUNTER — Ambulatory Visit
Admission: EM | Admit: 2020-01-20 | Discharge: 2020-01-20 | Discharge status: Against Medical Advice | Payer: Medicaid Other

## 2020-01-20 ENCOUNTER — Telehealth (INDEPENDENT_AMBULATORY_CARE_PROVIDER_SITE_OTHER): Payer: Self-pay

## 2020-01-20 NOTE — Telephone Encounter (Signed)
Copied from CRM [id number]. Topic: Appointment Scheduling - Scheduling Inquiry for Clinic >> Jan 20, 2020 11:27 AM Daphine Deutscher D wrote: Reason for CRM: pt called saying he has  a discharge from his penis and would like to be seen asap.  No pain no difficulty urinating.Marland KitchenMarland KitchenCallled earlier and was told to go to an urgent care  he tried but they could not se him because of his insurance.  Please advise  [phone]   Please advice

## 2020-01-22 ENCOUNTER — Other Ambulatory Visit (HOSPITAL_COMMUNITY)
Admission: RE | Admit: 2020-01-22 | Discharge: 2020-01-22 | Disposition: A | Discharge status: Home / Self Care | Payer: Medicaid Other | Source: Ambulatory Visit | Attending: Primary Care | Admitting: Primary Care

## 2020-01-22 ENCOUNTER — Other Ambulatory Visit: Payer: Self-pay

## 2020-01-22 ENCOUNTER — Encounter (INDEPENDENT_AMBULATORY_CARE_PROVIDER_SITE_OTHER): Payer: Self-pay | Admitting: Primary Care

## 2020-01-22 ENCOUNTER — Ambulatory Visit (INDEPENDENT_AMBULATORY_CARE_PROVIDER_SITE_OTHER): Payer: Self-pay | Admitting: Primary Care

## 2020-01-22 VITALS — BP 124/76 | HR 69 | Temp 97.5°F | Resp 16 | Wt 198.0 lb

## 2020-01-22 DIAGNOSIS — R369 Urethral discharge, unspecified: Secondary | ICD-10-CM | POA: Insufficient documentation

## 2020-01-22 LAB — POCT URINALYSIS DIP (CLINITEK)
Bilirubin, UA: NEGATIVE
Glucose, UA: NEGATIVE mg/dL
Ketones, POC UA: NEGATIVE mg/dL
Nitrite, UA: NEGATIVE
Spec Grav, UA: 1.02 (ref 1.010–1.025)
Urobilinogen, UA: 1 E.U./dL
pH, UA: 7 (ref 5.0–8.0)

## 2020-01-22 NOTE — Progress Notes (Signed)
[facility]   [id number]  Arrival Time:    NO:BSJGGEZ DISCHARGE  SUBJECTIVE:  Thomas Mclean [facility]. is a 39 y.o. male who presents with complaints a precipitating event, new sexual encounter.  Patient is sexually active with several  male partners.  Describes discharge as pus  clear discharge from pen .  She has tried OTC medications with/ without relief.  He reports worsening symptoms with pain and penis discharge.  Denies fever and chills.   ROS: As per HPI.  All other pertinent ROS negative.     Past Medical History:  Diagnosis Date   Hypercholesteremia    Hypertension 2014   No past surgical history on file. No Known Allergies Current Outpatient Medications on File Prior to Visit  Medication Sig Dispense Refill   amLODipine (NORVASC) 10 MG tablet Take 1 tablet (10 mg total) by mouth daily. 90 tablet 1   atorvastatin (LIPITOR) 10 MG tablet Take 1 tablet (10 mg total) by mouth daily. 90 tablet 3   hydrochlorothiazide (HYDRODIURIL) 25 MG tablet TAKE 1 TABLET BY MOUTH ONCE DAILY IN THE MORNING 90 tablet 1   losartan (COZAAR) 25 MG tablet Take 1 tablet (25 mg total) by mouth daily. 90 tablet 1   No current facility-administered medications on file prior to visit.    Social History   Socioeconomic History   Marital status: Single    Spouse name: Not on file   Number of children: Not on file   Years of education: Not on file   Highest education level: Not on file  Occupational History   Not on file  Tobacco Use   Smoking status: Current Every Day Smoker    Packs/day: 0.50    Types: Cigarettes    Start date: 02/28/1993   Smokeless tobacco: Never Used  Substance and Sexual Activity   Alcohol use: No    Alcohol/week: 0.0 standard drinks    Comment: EOD   Drug use: No   Sexual activity: Yes  Other Topics Concern   Not on file  Social History Narrative   Not on file   Social Determinants of Health   Financial Resource Strain:     Difficulty of Paying Living Expenses: Not on file  Food Insecurity:    Worried About Programme researcher, broadcasting/film/video in the Last Year: Not on file   [employer] of Food in the Last Year: Not on file  Transportation Needs:    Lack of Transportation (Medical): Not on file   Lack of Transportation (Non-Medical): Not on file  Physical Activity:    Days of Exercise per Week: Not on file   Minutes of Exercise per Session: Not on file  Stress:    Feeling of Stress : Not on file  Social Connections:    Frequency of Communication with Friends and Family: Not on file   Frequency of Social Gatherings with Friends and Family: Not on file   Attends Religious Services: Not on file   Active Member of Clubs or Organizations: Not on file   Attends Banker Meetings: Not on file   Marital Status: Not on file  Intimate Partner Violence:    Fear of Current or Ex-Partner: Not on file   Emotionally Abused: Not on file   Physically Abused: Not on file   Sexually Abused: Not on file   Family History  Problem Relation Age of Onset   Cancer Father    Hypertension Maternal Grandmother    Hypertension Maternal  Grandfather     OBJECTIVE:  Vitals:   01/22/20 1021  BP: 124/76  Pulse: 69  Resp: 16  Temp: (!) 97.5 F (36.4 C)  SpO2: 99%  Weight: 198 lb (89.8 kg)     General appearance: Alert, NAD, appears stated age Head: NCAT Throat: lips, mucosa, and tongue normal; teeth and gums normal Lungs: CTA bilaterally without adventitious breath sounds Heart: regular rate and rhythm.  Radial pulses 2+ symmetrical bilaterally Abdomen: soft, non-tender; bowel sounds normal; GU- yellow/pus  discharge Penile swab Skin: warm and dry Psychological:  Alert and cooperative. Normal mood and affect.  LABS:  Results for orders placed or performed in visit on 01/22/20  POCT URINALYSIS DIP (CLINITEK)  Result Value Ref Range   Color, UA other (A) yellow   Clarity, UA cloudy (A) clear   Glucose,  UA negative negative mg/dL   Bilirubin, UA negative negative   Ketones, POC UA negative negative mg/dL   Spec Grav, UA 5.366 4.403 - 1.025   Blood, UA trace-intact (A) negative   pH, UA 7.0 5.0 - 8.0   POC PROTEIN,UA trace negative, trace   Urobilinogen, UA 1.0 0.2 or 1.0 E.U./dL   Nitrite, UA Negative Negative   Leukocytes, UA Moderate (2+) (A) Negative      ASSESSMENT & PLAN:  1. Discharge from penis   urine culture  Provider swab penile for culture   No orders of the defined types were placed in this encounter.   Pending:   Penile-swab obtained.  We will follow up with you regarding abnormal results If tests results are positive, please abstain from sexual activity until you and your partner(s) have been treated Follow up with PCP or [facility] if symptoms persists  Reviewed expectations re: course of current medical issues. Questions answered. Outlined signs and symptoms indicating need for more acute intervention. Patient verbalized understanding.  Grayce Sessions

## 2020-01-22 NOTE — Progress Notes (Signed)
Noticed a yellowish d/c in boxers on Saturday morning. Denies dysuria or odor. Denies pain anywhere.

## 2020-01-24 LAB — CYTOLOGY, (ORAL, ANAL, URETHRAL) ANCILLARY ONLY
Chlamydia: NEGATIVE
Comment: NEGATIVE
Comment: NEGATIVE
Comment: NORMAL
Neisseria Gonorrhea: POSITIVE — AB
Trichomonas: NEGATIVE

## 2020-01-29 ENCOUNTER — Telehealth: Payer: Self-pay

## 2020-01-29 ENCOUNTER — Telehealth (INDEPENDENT_AMBULATORY_CARE_PROVIDER_SITE_OTHER): Payer: Self-pay

## 2020-01-29 NOTE — Telephone Encounter (Signed)
Patient aware that he will receive treatment at [facility] on 01/30/20.

## 2020-01-29 NOTE — Telephone Encounter (Signed)
Patient is aware of lab results. He viewed them on [facility]. He has scheduled treatment for 01/30/20. Maryjean Morn, CMA

## 2020-01-29 NOTE — Telephone Encounter (Signed)
Copied from CRM [id number]. Topic: General - Other >> Jan 28, 2020 11:03 AM Gwenlyn Fudge wrote: Reason for CRM: Pt called stating that he received his test results and is requesting to know if he should start taking medications or not. Please advise.  Please advise

## 2020-01-30 ENCOUNTER — Ambulatory Visit (INDEPENDENT_AMBULATORY_CARE_PROVIDER_SITE_OTHER): Payer: Self-pay | Admitting: Primary Care

## 2020-01-30 ENCOUNTER — Encounter (INDEPENDENT_AMBULATORY_CARE_PROVIDER_SITE_OTHER): Payer: Self-pay | Admitting: Primary Care

## 2020-01-30 ENCOUNTER — Other Ambulatory Visit: Payer: Self-pay

## 2020-01-30 VITALS — BP 126/82 | HR 58 | Temp 97.5°F | Ht 72.0 in | Wt 196.2 lb

## 2020-01-30 DIAGNOSIS — A549 Gonococcal infection, unspecified: Secondary | ICD-10-CM

## 2020-01-30 MED ORDER — AZITHROMYCIN 500 MG PO TABS
1000.0000 mg | ORAL_TABLET | Freq: Once | ORAL | Status: AC
  Administered 2020-01-30: 1000 mg via ORAL

## 2020-01-30 MED ORDER — CEFTRIAXONE SODIUM 1 G IJ SOLR: 1.0000 g | Freq: Once | INTRAMUSCULAR | Status: DC

## 2020-01-30 MED ORDER — CEFTRIAXONE SODIUM 500 MG IJ SOLR
500.0000 mg | Freq: Once | INTRAMUSCULAR | Status: AC
  Administered 2020-01-30: 500 mg via INTRAMUSCULAR

## 2020-02-03 NOTE — Progress Notes (Signed)
[facility]    [id number]  Arrival Time:    [id number]:[id number] discharge  SUBJECTIVE:  Thomas Mclean. is a 39 y.o. male who presents for treatment of Gonorrhea. Penile discharge and uncomfortable.  ROS: As per HPI.  All other pertinent ROS negative.     Past Medical History:  Diagnosis Date  . Hypercholesteremia   . Hypertension 2014   No past surgical history on file. No Known Allergies Current Outpatient Medications on File Prior to Visit  Medication Sig Dispense Refill  . amLODipine (NORVASC) 10 MG tablet Take 1 tablet (10 mg total) by mouth daily. 90 tablet 1  . atorvastatin (LIPITOR) 10 MG tablet Take 1 tablet (10 mg total) by mouth daily. 90 tablet 3  . hydrochlorothiazide (HYDRODIURIL) 25 MG tablet TAKE 1 TABLET BY MOUTH ONCE DAILY IN THE MORNING 90 tablet 1  . losartan (COZAAR) 25 MG tablet Take 1 tablet (25 mg total) by mouth daily. 90 tablet 1   No current facility-administered medications on file prior to visit.    Social History   Socioeconomic History  . Marital status: Single    Spouse name: Not on file  . Number of children: Not on file  . Years of education: Not on file  . Highest education level: Not on file  Occupational History  . Not on file  Tobacco Use  . Smoking status: Current Every Day Smoker    Packs/day: 0.50    Types: Cigarettes    Start date: 02/28/1993  . Smokeless tobacco: Never Used  Substance and Sexual Activity  . Alcohol use: No    Alcohol/week: 0.0 standard drinks    Comment: EOD  . Drug use: No  . Sexual activity: Yes  Other Topics Concern  . Not on file  Social History Narrative  . Not on file   Social Determinants of Health   Financial Resource Strain:   . Difficulty of Paying Living Expenses: Not on file  Food Insecurity:   . Worried About Programme researcher, broadcasting/film/video in the Last Year: Not on file  . Ran Out of Food in the Last Year: Not on file  Transportation Needs:   . Lack of Transportation  (Medical): Not on file  . Lack of Transportation (Non-Medical): Not on file  Physical Activity:   . Days of Exercise per Week: Not on file  . Minutes of Exercise per Session: Not on file  Stress:   . Feeling of Stress : Not on file  Social Connections:   . Frequency of Communication with Friends and Family: Not on file  . Frequency of Social Gatherings with Friends and Family: Not on file  . Attends Religious Services: Not on file  . Active Member of Clubs or Organizations: Not on file  . Attends Banker Meetings: Not on file  . Marital Status: Not on file  Intimate Partner Violence:   . Fear of Current or Ex-Partner: Not on file  . Emotionally Abused: Not on file  . Physically Abused: Not on file  . Sexually Abused: Not on file   Family History  Problem Relation Age of Onset  . Cancer Father   . Hypertension Maternal Grandmother   . Hypertension Maternal Grandfather     OBJECTIVE:  Vitals:   01/30/20 1038  BP: 126/82  Pulse: (!) 58  Temp: (!) 97.5 F (36.4 C)  TempSrc: Temporal  SpO2: 96%  Weight: 196 lb 3.2 oz (89 kg)  Height: 6' (1.829  m)     General appearance: Alert, NAD, appears stated age Head: NCATl Lungs: CTA bilaterally without adventitious breath sounds Heart: regular rate and rhythm.  Radial pulses 2+ symmetrical bilaterally Back: no CVA tenderness Abdomen: soft, non-tender; bowel sounds normal; no masses or organomegaly; no guarding or rebound tenderness Skin: warm and dry Psychological:  Alert and cooperative. Normal mood and affect.  LABS:  Results for orders placed or performed in visit on 01/22/20  POCT URINALYSIS DIP (CLINITEK)  Result Value Ref Range   Color, UA other (A) yellow   Clarity, UA cloudy (A) clear   Glucose, UA negative negative mg/dL   Bilirubin, UA negative negative   Ketones, POC UA negative negative mg/dL   Spec Grav, UA 3.976 7.341 - 1.025   Blood, UA trace-intact (A) negative   pH, UA 7.0 5.0 - 8.0   POC  PROTEIN,UA trace negative, trace   Urobilinogen, UA 1.0 0.2 or 1.0 E.U./dL   Nitrite, UA Negative Negative   Leukocytes, UA Moderate (2+) (A) Negative  Cytology (oral, anal, urethral) ancillary only  Result Value Ref Range   Trichomonas Negative    Chlamydia Negative    Neisseria Gonorrhea Positive (A)    Comment Normal Reference Range Trichomonas - Negative    Comment Normal Reference Ranger Chlamydia - Negative    Comment      Normal Reference Range Neisseria Gonorrhea - Negative    @EDLABS @  ASSESSMENT & PLAN:  1. Gonorrhea    Treatment azithromycin 1gm and Rocephin 500mg  and reported to health Department  Meds ordered this encounter  Medications  . DISCONTD: cefTRIAXone (ROCEPHIN) injection 1 g  . azithromycin (ZITHROMAX) tablet 1,000 mg  . cefTRIAXone (ROCEPHIN) injection 500 mg

## 2020-02-18 MED FILL — ATORVASTATIN CALCIUM 20 MG: 20 | 30 days supply | Qty: 30 | Fill #5

## 2020-02-18 MED FILL — AMLODIPINE BESYLATE 10 MG T: 10 | 30 days supply | Qty: 30 | Fill #5

## 2020-02-18 MED FILL — HYDROCHLOROTHIAZIDE 25 MG T: 25 | 30 days supply | Qty: 30 | Fill #5

## 2020-03-02 MED FILL — LOSARTAN POTASSIUM 50 MG TA: 50 | 30 days supply | Qty: 30 | Fill #1

## 2020-04-06 ENCOUNTER — Other Ambulatory Visit (INDEPENDENT_AMBULATORY_CARE_PROVIDER_SITE_OTHER): Payer: Self-pay | Admitting: Primary Care

## 2020-04-06 DIAGNOSIS — E7841 Elevated Lipoprotein(a): Secondary | ICD-10-CM

## 2020-04-06 MED FILL — AMLODIPINE BESYLATE 10 MG T: 10 | 30 days supply | Qty: 30 | Fill #0

## 2020-04-06 MED FILL — HYDROCHLOROTHIAZIDE 25 MG T: 25 | 90 days supply | Qty: 90 | Fill #0

## 2020-04-16 MED FILL — LOSARTAN POTASSIUM 50 MG TA: 50 | 30 days supply | Qty: 30 | Fill #2

## 2020-06-23 ENCOUNTER — Other Ambulatory Visit (INDEPENDENT_AMBULATORY_CARE_PROVIDER_SITE_OTHER): Payer: Self-pay | Admitting: Primary Care

## 2020-06-23 ENCOUNTER — Other Ambulatory Visit: Payer: Self-pay

## 2020-06-23 MED FILL — Amlodipine Besylate Tab 10 MG (Base Equivalent): ORAL | 30 days supply | Qty: 30 | Fill #0 | Status: AC

## 2020-06-23 MED FILL — Losartan Potassium Tab 50 MG: ORAL | 30 days supply | Qty: 30 | Fill #0 | Status: AC

## 2020-06-23 NOTE — Telephone Encounter (Signed)
Requested medication (s) are due for refill today: yes  Requested medication (s) are on the active medication list: yes  Last refill:  02/18/2020  Future visit scheduled: no  Notes to clinic: dur for labs and follow up on medication    Requested Prescriptions  Pending Prescriptions Disp Refills   atorvastatin (LIPITOR) 20 MG tablet 90 tablet 1    Sig: TAKE 1 TABLET (20 MG TOTAL) BY MOUTH DAILY.      Cardiovascular:  Antilipid - Statins Failed - 06/23/2020 10:01 AM      Failed - Total Cholesterol in normal range and within 360 days    Cholesterol, Total  Date Value Ref Range Status  04/10/2019 138 100 - 199 mg/dL Final          Failed - LDL in normal range and within 360 days    LDL Chol Calc (NIH)  Date Value Ref Range Status  04/10/2019 71 0 - 99 mg/dL Final          Failed - HDL in normal range and within 360 days    HDL  Date Value Ref Range Status  04/10/2019 50 >39 mg/dL Final          Failed - Triglycerides in normal range and within 360 days    Triglycerides  Date Value Ref Range Status  04/10/2019 87 0 - 149 mg/dL Final          Passed - Patient is not pregnant      Passed - Valid encounter within last 12 months    Recent Outpatient Visits           4 months ago Gonorrhea   [facility] Grayce Sessions, NP   5 months ago Discharge from penis   [facility] Grayce Sessions, NP   5 months ago Prediabetes   [facility] Grayce Sessions, NP   9 months ago Prediabetes   [facility] Grayce Sessions, NP   11 months ago Blood pressure check   [facility] Grayce Sessions, NP

## 2020-06-25 ENCOUNTER — Other Ambulatory Visit (INDEPENDENT_AMBULATORY_CARE_PROVIDER_SITE_OTHER): Payer: Self-pay | Admitting: Primary Care

## 2020-06-25 DIAGNOSIS — E7841 Elevated Lipoprotein(a): Secondary | ICD-10-CM

## 2020-06-25 MED ORDER — ATORVASTATIN CALCIUM 20 MG PO TABS
20.0000 mg | ORAL_TABLET | Freq: Every day | ORAL | 0 refills | Status: DC
  Filled 2020-06-25 – 2020-08-07 (×2): qty 30, 30d supply, fill #0

## 2020-06-26 ENCOUNTER — Other Ambulatory Visit: Payer: Self-pay

## 2020-07-03 ENCOUNTER — Other Ambulatory Visit: Payer: Self-pay

## 2020-08-07 ENCOUNTER — Other Ambulatory Visit: Payer: Self-pay

## 2020-08-07 MED FILL — Amlodipine Besylate Tab 10 MG (Base Equivalent): ORAL | 30 days supply | Qty: 30 | Fill #1 | Status: AC

## 2020-08-07 MED FILL — Hydrochlorothiazide Tab 25 MG: ORAL | 30 days supply | Qty: 30 | Fill #0 | Status: AC

## 2020-08-07 MED FILL — Losartan Potassium Tab 50 MG: ORAL | 30 days supply | Qty: 30 | Fill #1 | Status: AC

## 2020-09-23 ENCOUNTER — Other Ambulatory Visit: Payer: Self-pay

## 2020-09-23 ENCOUNTER — Other Ambulatory Visit (INDEPENDENT_AMBULATORY_CARE_PROVIDER_SITE_OTHER): Payer: Self-pay | Admitting: Primary Care

## 2020-09-23 DIAGNOSIS — E7841 Elevated Lipoprotein(a): Secondary | ICD-10-CM

## 2020-09-23 MED FILL — Hydrochlorothiazide Tab 25 MG: ORAL | 30 days supply | Qty: 30 | Fill #1 | Status: AC

## 2020-09-23 MED FILL — Losartan Potassium Tab 50 MG: ORAL | 30 days supply | Qty: 30 | Fill #2 | Status: AC

## 2020-09-23 MED FILL — Amlodipine Besylate Tab 10 MG (Base Equivalent): ORAL | 30 days supply | Qty: 30 | Fill #2 | Status: AC

## 2020-09-23 NOTE — Telephone Encounter (Signed)
Requested medication (s) are due for refill today: no  Requested medication (s) are on the active medication list: yes  Last refill:  08/07/2020  Future visit scheduled:  no  Notes to clinic:  needs labs Review for refill Patient should already be out of medication    Requested Prescriptions  Pending Prescriptions Disp Refills   atorvastatin (LIPITOR) 20 MG tablet 30 tablet 0    Sig: Take 1 tablet (20 mg total) by mouth daily.      Cardiovascular:  Antilipid - Statins Failed - 09/23/2020 10:35 AM      Failed - Total Cholesterol in normal range and within 360 days    Cholesterol, Total  Date Value Ref Range Status  04/10/2019 138 100 - 199 mg/dL Final          Failed - LDL in normal range and within 360 days    LDL Chol Calc (NIH)  Date Value Ref Range Status  04/10/2019 71 0 - 99 mg/dL Final          Failed - HDL in normal range and within 360 days    HDL  Date Value Ref Range Status  04/10/2019 50 >39 mg/dL Final          Failed - Triglycerides in normal range and within 360 days    Triglycerides  Date Value Ref Range Status  04/10/2019 87 0 - 149 mg/dL Final          Passed - Patient is not pregnant      Passed - Valid encounter within last 12 months    Recent Outpatient Visits           7 months ago Gonorrhea   [facility] Grayce Sessions, NP   8 months ago Discharge from penis   [facility] Grayce Sessions, NP   8 months ago Prediabetes   [facility] Grayce Sessions, NP   1 year ago Prediabetes   [facility] Grayce Sessions, NP   1 year ago Blood pressure check   [facility] Grayce Sessions, NP

## 2020-09-23 NOTE — Telephone Encounter (Signed)
Routed to PCP to refill if appropriate.  ?

## 2020-09-24 ENCOUNTER — Other Ambulatory Visit: Payer: Self-pay

## 2020-10-07 ENCOUNTER — Other Ambulatory Visit: Payer: Self-pay

## 2020-10-26 ENCOUNTER — Ambulatory Visit (INDEPENDENT_AMBULATORY_CARE_PROVIDER_SITE_OTHER): Payer: Medicaid Other | Admitting: Primary Care

## 2020-10-28 ENCOUNTER — Ambulatory Visit (INDEPENDENT_AMBULATORY_CARE_PROVIDER_SITE_OTHER): Payer: Medicaid Other | Admitting: Primary Care

## 2020-11-11 ENCOUNTER — Other Ambulatory Visit: Payer: Self-pay

## 2020-11-11 ENCOUNTER — Other Ambulatory Visit (INDEPENDENT_AMBULATORY_CARE_PROVIDER_SITE_OTHER): Payer: Self-pay | Admitting: Primary Care

## 2020-11-11 DIAGNOSIS — E7841 Elevated Lipoprotein(a): Secondary | ICD-10-CM

## 2020-11-11 MED FILL — Losartan Potassium Tab 50 MG: ORAL | 30 days supply | Qty: 30 | Fill #3 | Status: AC

## 2020-11-11 MED FILL — Hydrochlorothiazide Tab 25 MG: ORAL | 30 days supply | Qty: 30 | Fill #2 | Status: AC

## 2020-11-11 MED FILL — Amlodipine Besylate Tab 10 MG (Base Equivalent): ORAL | 30 days supply | Qty: 30 | Fill #3 | Status: AC

## 2020-11-11 NOTE — Telephone Encounter (Signed)
Requested medication (s) are due for refill today - yes  Requested medication (s) are on the active medication list -yes  Future visit scheduled -yes  Last refill: 08/07/20  Notes to clinic: Request RF: fails lab protocol- last lab 04/10/19  Requested Prescriptions  Pending Prescriptions Disp Refills   atorvastatin (LIPITOR) 20 MG tablet 30 tablet 0    Sig: Take 1 tablet (20 mg total) by mouth daily.     Cardiovascular:  Antilipid - Statins Failed - 11/11/2020  9:19 AM      Failed - Total Cholesterol in normal range and within 360 days    Cholesterol, Total  Date Value Ref Range Status  04/10/2019 138 100 - 199 mg/dL Final          Failed - LDL in normal range and within 360 days    LDL Chol Calc (NIH)  Date Value Ref Range Status  04/10/2019 71 0 - 99 mg/dL Final          Failed - HDL in normal range and within 360 days    HDL  Date Value Ref Range Status  04/10/2019 50 >39 mg/dL Final          Failed - Triglycerides in normal range and within 360 days    Triglycerides  Date Value Ref Range Status  04/10/2019 87 0 - 149 mg/dL Final          Passed - Patient is not pregnant      Passed - Valid encounter within last 12 months    Recent Outpatient Visits           9 months ago Gonorrhea   [facility] Grayce Sessions, NP   9 months ago Discharge from penis   [facility] Grayce Sessions, NP   10 months ago Prediabetes   [facility] Grayce Sessions, NP   1 year ago Prediabetes   [facility] Grayce Sessions, NP   1 year ago Blood pressure check   [facility] Grayce Sessions, NP       Future Appointments             In 1 week Randa Evens, Kinnie Scales, NP [facility]               Requested Prescriptions  Pending Prescriptions Disp Refills   atorvastatin (LIPITOR) 20 MG tablet 30 tablet 0    Sig:  Take 1 tablet (20 mg total) by mouth daily.     Cardiovascular:  Antilipid - Statins Failed - 11/11/2020  9:19 AM      Failed - Total Cholesterol in normal range and within 360 days    Cholesterol, Total  Date Value Ref Range Status  04/10/2019 138 100 - 199 mg/dL Final          Failed - LDL in normal range and within 360 days    LDL Chol Calc (NIH)  Date Value Ref Range Status  04/10/2019 71 0 - 99 mg/dL Final          Failed - HDL in normal range and within 360 days    HDL  Date Value Ref Range Status  04/10/2019 50 >39 mg/dL Final          Failed - Triglycerides in normal range and within 360 days    Triglycerides  Date Value Ref Range Status  04/10/2019 87  0 - 149 mg/dL Final          Passed - Patient is not pregnant      Passed - Valid encounter within last 12 months    Recent Outpatient Visits           9 months ago Gonorrhea   [facility] Grayce Sessions, NP   9 months ago Discharge from penis   [facility] Grayce Sessions, NP   10 months ago Prediabetes   [facility] Grayce Sessions, NP   1 year ago Prediabetes   [facility] Grayce Sessions, NP   1 year ago Blood pressure check   [facility] Grayce Sessions, NP       Future Appointments             In 1 week Grayce Sessions, NP [facility]

## 2020-11-18 ENCOUNTER — Other Ambulatory Visit: Payer: Self-pay

## 2020-11-18 ENCOUNTER — Encounter (INDEPENDENT_AMBULATORY_CARE_PROVIDER_SITE_OTHER): Payer: Self-pay | Admitting: Primary Care

## 2020-11-18 ENCOUNTER — Ambulatory Visit (INDEPENDENT_AMBULATORY_CARE_PROVIDER_SITE_OTHER): Payer: Self-pay | Admitting: Primary Care

## 2020-11-18 VITALS — BP 145/82 | HR 64 | Temp 97.5°F | Ht 72.0 in | Wt 183.4 lb

## 2020-11-18 DIAGNOSIS — Z7141 Alcohol abuse counseling and surveillance of alcoholic: Secondary | ICD-10-CM

## 2020-11-18 DIAGNOSIS — R7303 Prediabetes: Secondary | ICD-10-CM

## 2020-11-18 DIAGNOSIS — F17210 Nicotine dependence, cigarettes, uncomplicated: Secondary | ICD-10-CM

## 2020-11-18 DIAGNOSIS — F17200 Nicotine dependence, unspecified, uncomplicated: Secondary | ICD-10-CM

## 2020-11-18 DIAGNOSIS — I10 Essential (primary) hypertension: Secondary | ICD-10-CM

## 2020-11-18 DIAGNOSIS — Z760 Encounter for issue of repeat prescription: Secondary | ICD-10-CM

## 2020-11-18 DIAGNOSIS — Z6824 Body mass index (BMI) 24.0-24.9, adult: Secondary | ICD-10-CM

## 2020-11-18 MED ORDER — AMLODIPINE BESYLATE 10 MG PO TABS
10.0000 mg | ORAL_TABLET | Freq: Every day | ORAL | 1 refills | Status: DC
  Filled 2020-11-18: qty 90, 90d supply, fill #0
  Filled 2020-12-24: qty 30, 30d supply, fill #0
  Filled 2021-01-29: qty 30, 30d supply, fill #1
  Filled 2021-03-10: qty 30, 30d supply, fill #0

## 2020-11-18 MED ORDER — HYDROCHLOROTHIAZIDE 25 MG PO TABS
ORAL_TABLET | Freq: Every morning | ORAL | 1 refills | Status: DC
  Filled 2020-11-18: qty 90, fill #0
  Filled 2020-12-24: qty 30, 30d supply, fill #0
  Filled 2021-01-29: qty 30, 30d supply, fill #1
  Filled 2021-03-10: qty 30, 30d supply, fill #0

## 2020-11-18 MED ORDER — LOSARTAN POTASSIUM 50 MG PO TABS
25.0000 mg | ORAL_TABLET | Freq: Every day | ORAL | 1 refills | Status: DC
Start: 2020-11-18 — End: 2020-11-18
  Filled 2020-11-18: qty 90, 180d supply, fill #0

## 2020-11-18 MED ORDER — LOSARTAN POTASSIUM 50 MG PO TABS
ORAL_TABLET | Freq: Every day | ORAL | 1 refills | Status: DC
  Filled 2020-11-18: qty 90, fill #0
  Filled 2020-12-24: qty 30, 30d supply, fill #0
  Filled 2021-01-29: qty 30, 30d supply, fill #1
  Filled 2021-03-10: qty 30, 30d supply, fill #0
  Filled 2021-04-21: qty 30, 30d supply, fill #1
  Filled 2021-05-28: qty 30, 30d supply, fill #2
  Filled 2021-07-21: qty 30, 30d supply, fill #3

## 2020-11-18 NOTE — Patient Instructions (Signed)
COMIRNATY and Pfizer-BioNTech COVID-19 Vaccine to Prevent Coronavirus Disease: Fact Sheet for Recipients and Caregivers FOR 42 YEARS OF AGE AND OLDER You are being offered either COMIRNATY (COVID-19 Vaccine, mRNA) or the Pfizer-BioNTech COVID-19 Vaccine to prevent Coronavirus Disease 2019 (COVID-19) caused by SARS-CoV-2. This Vaccine Information Fact Sheet for Recipients and Caregivers comprises the Fact Sheet for the authorized Pfizer-BioNTech COVID-19 Vaccine and also includes information about the FDA-licensed vaccine, COMIRNATY (COVID-19 Vaccine, mRNA) for use in individuals 11 years of age and older. The FDA-approved COMIRNATY (COVID-19 Vaccine, mRNA) and the Pfizer-BioNTech COVID-19 Vaccine authorized for Emergency Use Authorization (EUA) for individuals 56 years of age and older, when prepared according to their respective instructions for use, can be used interchangeably.1 COMIRNATY (COVID-19 Vaccine, mRNA) is an FDA-approved COVID-19 vaccine made by [employer] for [employer]. It is approved as a 2-dose series for prevention of COVID-19 in individuals 55 years of age and older. It is also authorized under EUA to provide: a 2-dose primary series to individuals 12 through 42 years of age; a third primary series dose to individuals 21 years of age and older with certain kinds of immunocompromise; a first booster dose to individuals 58 years of age and older who have completed a primary series with Pfizer-BioNTech COVID-19 Vaccine or COMIRNATY (COVID-19 Vaccine, mRNA); a first booster dose to individuals 59 years of age and older who have completed primary vaccination with another authorized or approved COVID-19 vaccine. The booster schedule is based on the labeling information of the vaccine used for the primary series; a second booster dose to individuals 38 years of age and older who have received a first booster dose of any authorized or approved COVID-19 vaccine; and a second booster dose to  individuals 10 years of age and older with certain kinds of immunocompromise and who have received a first booster dose of any authorized or approved COVID-19 vaccine. The Pfizer-BioNTech COVID-19 Vaccine has received EUA from FDA to provide: a 2-dose primary series to individuals 100 years of age and older; a third primary series dose to individuals 67 years of age and older with certain kinds of immunocompromise; a first booster dose to individuals 7 years of age and older who have completed a primary series with Pfizer-BioNTech COVID-19 Vaccine or COMIRNATY (COVID-19 Vaccine, mRNA); a first booster dose to individuals 63 years of age and older who have completed primary vaccination with another authorized or approved COVID-19 vaccine. The booster schedule is based on the labeling information of the vaccine used for the primary series; a second booster dose to individuals 49 years of age and older who have received a first booster dose of any authorized or approved COVID-19 vaccine; and a second booster dose to individuals 63 years of age and older with certain kinds of immunocompromise and who have received a first booster dose of any authorized or approved COVID-19 vaccine. This Vaccine Information Fact Sheet contains information to help you understand the risks and benefits of COMIRNATY (COVID-19 Vaccine, mRNA) and the Pfizer-BioNTech COVID-19 Vaccine, which you may receive because there is currently a pandemic of COVID-19. Talk to your vaccination provider if you have questions.  This Fact Sheet may have been updated. For the most recent Fact Sheet, please see www.TripMetro.hu. 1 When prepared according to their respective instructions for use, the FDA-approved COMIRNATY (COVID-19 Vaccine, mRNA) and the EUA-authorized Pfizer-BioNTech COVID-19 Vaccine for individuals 90 years of age and older can be used interchangeably without presenting any safety or effectiveness concerns. What you need to know  before you get this vaccine What is COVID-19? COVID-19 disease is caused by a coronavirus called SARS-CoV-2. You can get COVID-19 through contact with another person who has the virus. It is predominantly a respiratory illness that can affect other organs. People with COVID-19 have had a wide range of symptoms reported, ranging from mild symptoms to severe illness leading to death. Symptoms may appear 2 to 14 days after exposure to the virus. Symptoms may include: fever or chills; cough; shortness of breath; fatigue; muscle or body aches; headache; new loss of taste or smell; sore throat; congestion or runny nose; nausea or vomiting; diarrhea. What is COMIRNATY (COVID-19 Vaccine, mRNA) and how is it related to the Pfizer-BioNTech COVID-19 Vaccine? COMIRNATY (COVID-19 Vaccine, mRNA) and the Pfizer-BioNTech COVID-19 Vaccine, when prepared according to their respective instructions for use, can be used interchangeably. For more information on EUA, see the "What is an Emergency Use Authorization (EUA)?" section at the end of this Fact Sheet. What should you mention to your vaccination provider before you get the vaccine? Tell the vaccination provider about all of your medical conditions, including if you: have any allergies have had myocarditis (inflammation of the heart muscle) or pericarditis (inflammation of the lining outside the heart) have a fever have a bleeding disorder or are on a blood thinner are immunocompromised or are on a medicine that affects your immune system are pregnant or plan to become pregnant are breastfeeding have received another COVID-19 vaccine have ever fainted in association with an injection How is the vaccine given? The Pfizer-BioNTech COVID-19 Vaccine or COMIRNATY (COVID-19 Vaccine, mRNA) will be given to you as an injection into the muscle. Primary Series: The vaccine is administered as a 2-dose series, 3 weeks apart. A third primary series dose may be administered at  least 4 weeks after the second dose to individuals with certain kinds of immunocompromise. Booster Dose: A first booster dose of the vaccine may be administered at least 5 months after completion of a primary series of the Pfizer-BioNTech COVID-19 Vaccine or COMIRNATY (COVID-19 Vaccine, mRNA) to individuals 56 years of age and older. A first booster dose of the vaccine may be administered to individuals 27 years of age and older who have completed primary vaccination with another authorized or approved COVID-19 vaccine. Please check with your healthcare provider regarding timing of the booster dose. A second booster dose of the vaccine may be administered to individuals 26 years of age and older at least 4 months after receipt of a first booster dose of any authorized or approved COVID-19 vaccine. A second booster dose of the vaccine may be administered at least 4 months after receipt of a first booster dose of any authorized or approved COVID-19 vaccine to individuals 29 years of age and older with certain kinds of immunocompromise. The vaccine may not protect everyone. Who should NOT get the vaccine? You should not get the vaccine if you: had a severe allergic reaction after a previous dose of this vaccine had a severe allergic reaction to any ingredient of this vaccine. What are the ingredients in the vaccines? COMIRNATY (COVID-19 Vaccine, mRNA) and the authorized formulations of the vaccine include the following ingredients: mRNA and lipids ((4-hydroxybutyl)azanediyl)bis(hexane-6,1-diyl)bis(2-hexyldecanoate), 2 [(polyethylene glycol)-2000]-N,N-ditetradecylacetamide, 1,2- Distearoyl-sn-glycero-3-phosphocholine, and cholesterol). Pfizer-BioNTech COVID-19 vaccines for individuals 34 years of age and older contain 1 of the following sets of additional ingredients; ask the vaccination provider which version is being administered: potassium chloride, monobasic potassium phosphate, sodium chloride,  dibasic sodium phosphate dihydrate, and sucrose OR  tromethamine, tromethamine hydrochloride, and sucrose COMIRNATY (COVID-19 Vaccine, mRNA) contains 1 of the following sets of additional ingredients; ask the vaccination provider which version is being administered: potassium chloride, monobasic potassium phosphate, sodium chloride, dibasic sodium phosphate dihydrate, and sucrose OR tromethamine, tromethamine hydrochloride, and sucrose Has the vaccine been used before? Yes. In clinical trials, approximately 23,000 individuals 26 years of age and older have received at least 1 dose of the vaccine. Data from these clinical trials supported the Emergency Use Authorization of the Pfizer-BioNTech COVID-19 Vaccines and the approval of COMIRNATY (COVID-19 Vaccine, mRNA). Millions of individuals have received the vaccine under EUA since February 08, 2019. The vaccine that is authorized for use in individuals 64 years of age and older includes two formulations; one that was studied in clinical trials and used under EUA, and one with the same mRNA and lipids but different inactive ingredients. The use of the different inactive ingredients helps stabilize the vaccine under refrigerated temperatures and the formulation can be administered without dilution. What are the benefits of the vaccine? The vaccine has been shown to prevent COVID-19.  The duration of protection against COVID-19 is currently unknown. What are the risks of the vaccine? There is a remote chance that the vaccine could cause a severe allergic reaction. A severe allergic reaction would usually occur within a few minutes to 1 hour after getting a dose of the vaccine. For this reason, your vaccination provider may ask you to stay at the place where you received your vaccine for monitoring after vaccination. Signs of a severe allergic reaction can include: Difficulty breathing Swelling of your face and throat A fast heartbeat A bad rash all over  your body Dizziness and weakness Myocarditis (inflammation of the heart muscle) and pericarditis (inflammation of the lining outside the heart) have occurred in some people who have received the vaccine, more commonly in males under 85 years of age than among females and older males. In most of these people, symptoms began within a few days following receipt of the second dose of vaccine. The chance of having this occur is very low. You should seek medical attention right away if you have any of the following symptoms after receiving the vaccine: Chest pain Shortness of breath Feelings of having a fast-beating, fluttering, or pounding heart Side effects that have been reported with the vaccine include: severe allergic reactions non-severe allergic reactions such as rash, itching, hives, or swelling of the face myocarditis (inflammation of the heart muscle) pericarditis (inflammation of the lining outside the heart) injection site pain tiredness headache muscle pain chills joint pain fever injection site swelling injection site redness nausea feeling unwell swollen lymph nodes (lymphadenopathy) decreased appetite diarrhea vomiting arm pain fainting in association with injection of the vaccine These may not be all the possible side effects of the vaccine. Serious and unexpected side effects may occur. The possible side effects of the vaccine are still being studied in clinical trials. What should I do about side effects? If you experience a severe allergic reaction, call 9-1-1, or go to the nearest hospital.  Call the vaccination provider or your healthcare provider if you have any side effects that bother you or do not go away.  Report vaccine side effects to FDA/CDC Vaccine Adverse Event Reporting System (VAERS). The VAERS toll-free number is 585-762-2326 or report online to https://vaers.https://www.washington.net/. Please include either "COMIRNATY (COVID-19 Vaccine, mRNA)" or  "Pfizer-BioNTech COVID-19 Vaccine EUA", as appropriate, in the first line of box #18 of the report form.  In addition, you can report side effects to [employer]. at the contact information provided below. Website: www.pfizersafetyreporting.com Fax number: [phone] Telephone number: [phone] You may also be given an option to enroll in v-safe. V-safe is a new voluntary smartphone-based tool that uses text messaging and web surveys to check in with people who have been vaccinated to identify potential side effects after COVID-19 vaccination. V-safe asks questions that help CDC monitor the safety of COVID-19 vaccines. V-safe also provides second-dose reminders if needed and live telephone follow-up by CDC if participants report a significant health impact following COVID-19 vaccination. For more information on how to sign up, visit: [email or web address].fi. What if I decide not to get COMIRNATY (COVID-19 Vaccine, mRNA) or the Pfizer-BioNTech COVID-19 Vaccine? Under the EUA, it is your choice to receive or not receive the vaccine. Should you decide not to receive it, it will not change your standard medical care. Are other choices available for preventing COVID-19 besides COMIRNATY (COVID-19 Vaccine, mRNA) or the Pfizer-BioNTech COVID-19 Vaccine? Another choice for preventing COVID-19 is SPIKEVAX, an FDA-approved COVID-19 vaccine. Other vaccines to prevent COVID-19 may be available under Emergency Use Authorization. Can I receive the COMIRNATY (COVID-19 Vaccine, mRNA) or Pfizer-BioNTech COVID-19 Vaccine at the same time as other vaccines? Data have not yet been submitted to FDA on administration of COMIRNATY (COVID-19 Vaccine, mRNA) or the Pfizer-BioNTech COVID-19 Vaccine at the same time with other vaccines. If you are considering receiving COMIRNATY (COVID-19 Vaccine, mRNA) or the Pfizer-BioNTech COVID-19 Vaccine with other vaccines, discuss your options with your healthcare provider. What if I am  immunocompromised? If you are immunocompromised, you may receive a third primary series dose of the vaccine. The third dose may still not provide full immunity to COVID-19 in people who are immunocompromised, and you should continue to maintain physical precautions to help prevent COVID-19. In addition, after you received a first booster dose, you may receive a second booster dose of the vaccine if you are 46 years of age or older. Your close contacts should be vaccinated as appropriate. What if I am pregnant or breastfeeding? If you are pregnant or breastfeeding, discuss your options with your healthcare provider. Will the vaccine give me COVID-19? No. The vaccine does not contain SARS-CoV-2 and cannot give you COVID-19. Keep your vaccination card When you get your first dose, you will get a vaccination card to show you when to return for your next dose(s) of the vaccine. Remember to bring your card when you return. Additional information If you have questions, visit the website or call the telephone number provided below. Global website: www.[email or web address] Telephone number: [phone] (1-877-VAX-CO19) To access the most recent Fact Sheets, please scan the QR code provided below. How can I learn more? Ask the vaccination provider. Visit CDC at BeginnerSteps.be. Visit FDA at CareerCue.tn. Contact your local or state public health department. Where will my vaccination information be recorded? The vaccination provider may include your vaccination information in your state/local jurisdiction's Immunization Information System (IIS) or other designated system. This will ensure that you receive the same vaccine when you return for the second dose. For more information about IISs visit: [email or web address]. Can I be  charged an administration fee for receipt of the COVID-19 vaccine? No. At this time, the provider cannot charge you for a vaccine dose and you cannot be charged an out-of-pocket vaccine administration fee or any other fee if only receiving a COVID-19 vaccination. However, vaccination providers may seek appropriate reimbursement from a program or plan that covers COVID-19  vaccine administration fees for the vaccine recipient (private insurance, Medicare, [employer], [place] [HRSA] COVID-19 Uninsured Program for non-insured recipients). Where can I report cases of suspected fraud? Individuals becoming aware of any potential violations of the CDC COVID-19 Vaccination Program requirements are encouraged to report them to the Office of the [employer], U.S. Department of Health and [employer], at 1-800-HHS-TIPS or https://TIPS.HHS.GOV. What is the Countermeasures Injury Compensation Program? The Countermeasures Injury Compensation Program (CICP) is a federal program that may help pay for costs of medical care and other specific expenses of certain people who have been seriously injured by certain medicines or vaccines, including this vaccine. Generally, a claim must be submitted to the CICP within one (1) year from the date of receiving the vaccine. To learn more about this program, visit https://www.bennett.info/ or call 9734132857. What is an emergency use authorization (EUA)? An Emergency Use Authorization (EUA) is a mechanism to facilitate the availability and use of medical products, including vaccines, during public health emergencies, such as the current COVID-19 pandemic. An EUA is supported by a Risk manager (HHS) declaration that circumstances exist to justify the emergency use of drugs and biological products during the COVID-19 pandemic.  The FDA may issue an EUA when certain criteria are met, which includes that there are no adequate,  approved, available alternatives. In addition, the FDA decision is based on the totality of scientific evidence available showing that the product may be effective to prevent COVID-19 during the COVID-19 pandemic and that the known and potential benefits of the product outweigh the known and potential risks of the product. All of these criteria must be met to allow for the product to be used in the treatment of patients during the COVID-19 pandemic.  This EUA for the Pfizer-BioNTech COVID-19 Vaccine and COMIRNATY (COVID-19 Vaccine, mRNA) will end when the Secretary of HHS determines that the circumstances justifying the EUA no longer exist or when there is a change in the approval status of the product such that an EUA is no longer needed. Manufactured by  [employer] [place] [address] Manufactured for [employer] [address], [place] [id number] Revised: 26 May 2020 This information is not intended to replace advice given to you by your health care provider. Make sure you discuss any questions you have with your health care provider. Document Revised: 07/02/2020 Document Reviewed: 07/02/2020 Elsevier Patient Education  [place].

## 2020-11-18 NOTE — Progress Notes (Signed)
[facility]   Mr.Thomas Mclean. is a 40 y.o. male presents for hypertension evaluation, Denies shortness of breath, headaches, chest pain or lower extremity edema, sudden onset, vision changes, unilateral weakness, dizziness, paresthesias   Patient reports adherence with medications. However, did pick them up late due to finances. He has started taking his medications this week.  Dietary habits include: monitoring sodium  Exercise habits include:walking  Family / Social history: Grandfather-fraternal MI- decease    Past Medical History:  Diagnosis Date   Hypercholesteremia    Hypertension 2014   No past surgical history on file. No Known Allergies Current Outpatient Medications on File Prior to Visit  Medication Sig Dispense Refill   amLODipine (NORVASC) 10 MG tablet TAKE 1 TABLET (10 MG TOTAL) BY MOUTH DAILY. 90 tablet 1   atorvastatin (LIPITOR) 20 MG tablet Take 1 tablet (20 mg total) by mouth daily. 30 tablet 0   hydrochlorothiazide (HYDRODIURIL) 25 MG tablet TAKE 1 TABLET BY MOUTH ONCE DAILY IN THE MORNING 90 tablet 1   losartan (COZAAR) 50 MG tablet TAKE 1 TABLET (50 MG TOTAL) BY MOUTH DAILY. 90 tablet 3   amLODipine (NORVASC) 10 MG tablet TAKE 1 TABLET (10 MG TOTAL) BY MOUTH DAILY. 90 tablet 1   hydrochlorothiazide (HYDRODIURIL) 25 MG tablet TAKE 1 TABLET BY MOUTH ONCE DAILY IN THE MORNING 90 tablet 1   losartan (COZAAR) 50 MG tablet TAKE 1 TABLET (50 MG TOTAL) BY MOUTH DAILY. 90 tablet 3   No current facility-administered medications on file prior to visit.   Social History   Socioeconomic History   Marital status: Single    Spouse name: Not on file   Number of children: Not on file   Years of education: Not on file   Highest education level: Not on file  Occupational History   Not on file  Tobacco Use   Smoking status: Every Day    Packs/day: 0.50    Types: Cigarettes    Start date: 02/28/1993   Smokeless tobacco: Never  Substance and  Sexual Activity   Alcohol use: No    Alcohol/week: 0.0 standard drinks    Comment: EOD   Drug use: No   Sexual activity: Yes  Other Topics Concern   Not on file  Social History Narrative   Not on file   Social Determinants of Health   Financial Resource Strain: Not on file  Food Insecurity: Not on file  Transportation Needs: Not on file  Physical Activity: Not on file  Stress: Not on file  Social Connections: Not on file  Intimate Partner Violence: Not on file   Family History  Problem Relation Age of Onset   Cancer Father    Hypertension Maternal Grandmother    Hypertension Maternal Grandfather      OBJECTIVE:  Vitals:   11/18/20 1102  BP: (!) 145/82  Pulse: 64  Temp: (!) 97.5 F (36.4 C)  TempSrc: Temporal  SpO2: 95%  Weight: 183 lb 6.4 oz (83.2 kg)  Height: 6' (1.829 m)    Physical Exam General: No apparent distress. Eyes: Extraocular eye movements intact, pupils equal and round. Neck: Supple, trachea midline. Thyroid: No enlargement, mobile without fixation, no tenderness. Cardiovascular: Regular rhythm and rate, no murmur, normal radial pulses. Respiratory: Normal respiratory effort, clear to auscultation. Gastrointestinal: Normal pitch active bowel sounds, nontender abdomen without distention or appreciable hepatomegaly. Neurologic: no weakness , headaches,  Musculoskeletal: Normal muscle tone, no tenderness on palpation of tibia, no excessive thoracic  kyphosis. Skin: Appropriate warmth, no visible rash. Mental status: Alert, conversant, speech clear, thought logical, appropriate mood and affect, no hallucinations or delusions evident. Hematologic/lymphatic: No cervical adenopathy, no visible ecchymoses.   Review of Systems  All other systems reviewed and are negative.  Last 3 Office BP readings: BP Readings from Last 3 Encounters:  11/18/20 (!) 145/82  01/30/20 126/82  01/22/20 124/76    BMET    Component Value Date/Time   NA 138 12/02/2018  0846   NA 137 11/16/2017 0900   K 3.7 12/02/2018 0846   CL 103 12/02/2018 0846   CO2 24 12/02/2018 0846   GLUCOSE 113 (H) 12/02/2018 0846   BUN 8 12/02/2018 0846   BUN 10 11/16/2017 0900   CREATININE 0.77 12/02/2018 0846   CALCIUM 9.6 12/02/2018 0846   GFRNONAA >60 12/02/2018 0846   GFRAA >60 12/02/2018 0846    Renal function: CrCl cannot be calculated (Patient's most recent lab result is older than the maximum 21 days allowed.).  Clinical ASCVD: Yes  The 10-year ASCVD risk score (Arnett DK, et al., 2019) is: 3.2%   Values used to calculate the score:     Age: 60 years     Sex: Male     Is Non-Hispanic African American: No     Diabetic: No     Tobacco smoker: Yes     Systolic Blood Pressure: 145 mmHg     Is BP treated: Yes     HDL Cholesterol: 50 mg/dL     Total Cholesterol: 138 mg/dL  ASCVD risk factors include- [place]  Khamarion was seen today for hypertension.  Diagnoses and all orders for this visit:  Tobacco dependence - I have recommended complete cessation of tobacco use. I have discussed various options available for assistance with tobacco cessation including over the counter methods (Nicotine gum, patch and lozenges). We also discussed prescription options (Chantix, Nicotine Inhaler / Nasal Spray). The patient is not interested in pursuing any prescription tobacco cessation options at this time. - Patient declines at this time.   Alcohol cessation counseling Patient informed PCP he has not had alcohol 2 weeks and expecting to stop. Previously drinking every day and realize it was interfering in his life.  Hypertension, unspecified type -Counseled on lifestyle modifications for blood pressure control including reduced dietary sodium, increased exercise, weight reduction and adequate sleep. Also, educated patient about the risk for cardiovascular events, stroke and heart attack. Also counseled patient about the importance of medication adherence. If you participate in  smoking, it is important to stop using tobacco as this will increase the risks associated with uncontrolled blood pressure.   -Hypertension longstanding diagnosed currently amlodipine 10mg  , HCTZ 25mg   and Losartan 50mg  on current medications. Patient is adherent with current medications.  Goal BP:  For patients younger than 60: Goal BP < 130/80. For patients 60 and older: Goal BP < 140/90. For patients with diabetes: Goal BP < 130/80. Your most recent BP: 145/82  Minimize salt intake. Minimize alcohol intake   BMI 24.0-24.9, adult BMI wnl. Instructed: Make sure you are drinking at least 48 oz of water per day. Work on eating a low fat, heart healthy diet and participate in regular aerobic exercise program to control as well. Exercise at least  30 minutes per day-5 days per week. Avoid red meat. No fried foods. No junk foods, sodas, sugary foods or drinks, unhealthy snacking, alcohol or smoking.     This note has been created with  Airline pilot. Any transcriptional errors are unintentional.   Grayce Sessions, NP 11/18/2020, 11:05 AM

## 2020-12-24 ENCOUNTER — Other Ambulatory Visit: Payer: Self-pay

## 2021-01-29 ENCOUNTER — Other Ambulatory Visit: Payer: Self-pay

## 2021-02-17 ENCOUNTER — Ambulatory Visit (INDEPENDENT_AMBULATORY_CARE_PROVIDER_SITE_OTHER): Payer: Medicaid Other | Admitting: Primary Care

## 2021-03-10 ENCOUNTER — Other Ambulatory Visit: Payer: Self-pay

## 2021-03-12 ENCOUNTER — Other Ambulatory Visit: Payer: Self-pay

## 2021-04-05 ENCOUNTER — Encounter (INDEPENDENT_AMBULATORY_CARE_PROVIDER_SITE_OTHER): Payer: Medicaid Other | Admitting: Primary Care

## 2021-04-07 ENCOUNTER — Ambulatory Visit (INDEPENDENT_AMBULATORY_CARE_PROVIDER_SITE_OTHER): Payer: Medicaid Other | Admitting: Primary Care

## 2021-04-08 ENCOUNTER — Encounter (INDEPENDENT_AMBULATORY_CARE_PROVIDER_SITE_OTHER): Payer: Self-pay | Admitting: Primary Care

## 2021-04-08 ENCOUNTER — Ambulatory Visit (INDEPENDENT_AMBULATORY_CARE_PROVIDER_SITE_OTHER): Payer: Self-pay | Admitting: Primary Care

## 2021-04-08 ENCOUNTER — Other Ambulatory Visit (HOSPITAL_COMMUNITY)
Admission: RE | Admit: 2021-04-08 | Discharge: 2021-04-08 | Disposition: A | Discharge status: Home / Self Care | Payer: Medicaid Other | Source: Ambulatory Visit | Attending: Primary Care | Admitting: Primary Care

## 2021-04-08 ENCOUNTER — Other Ambulatory Visit: Payer: Self-pay

## 2021-04-08 VITALS — BP 145/84 | HR 61 | Temp 97.9°F | Ht 72.0 in | Wt 193.6 lb

## 2021-04-08 DIAGNOSIS — Z113 Encounter for screening for infections with a predominantly sexual mode of transmission: Secondary | ICD-10-CM | POA: Diagnosis not present

## 2021-04-08 DIAGNOSIS — Z0001 Encounter for general adult medical examination with abnormal findings: Secondary | ICD-10-CM

## 2021-04-08 DIAGNOSIS — R7303 Prediabetes: Secondary | ICD-10-CM

## 2021-04-08 DIAGNOSIS — Z0000 Encounter for general adult medical examination without abnormal findings: Secondary | ICD-10-CM

## 2021-04-08 DIAGNOSIS — I10 Essential (primary) hypertension: Secondary | ICD-10-CM

## 2021-04-08 LAB — POCT GLYCOSYLATED HEMOGLOBIN (HGB A1C): Hemoglobin A1C: 5.9 % — AB (ref 4.0–5.6)

## 2021-04-08 MED ORDER — HYDROCHLOROTHIAZIDE 25 MG PO TABS
ORAL_TABLET | Freq: Every morning | ORAL | 1 refills | Status: DC
  Filled 2021-04-08 – 2021-04-21 (×2): qty 30, 30d supply, fill #0
  Filled 2021-05-28: qty 30, 30d supply, fill #1
  Filled 2021-07-21: qty 30, 30d supply, fill #2
  Filled 2021-09-06: qty 30, 30d supply, fill #3

## 2021-04-08 MED ORDER — AMLODIPINE BESYLATE 10 MG PO TABS
10.0000 mg | ORAL_TABLET | Freq: Every day | ORAL | 1 refills | Status: DC
  Filled 2021-04-08 – 2021-04-21 (×2): qty 30, 30d supply, fill #0
  Filled 2021-05-28: qty 30, 30d supply, fill #1
  Filled 2021-07-21: qty 30, 30d supply, fill #2
  Filled 2021-09-06: qty 30, 30d supply, fill #3

## 2021-04-08 NOTE — Progress Notes (Signed)
Pt is fasting  Did not take medications this am

## 2021-04-08 NOTE — Progress Notes (Signed)
Thomas Mclean  Mr. Mandy [place]lin [place]. is a 41 y.o. male presents to office today for annual physical exam examination.    Concerns today include: 1. Normal f/u on Bp and Prediabetes no changes , fasting , did not take any medication this AM  Occupation: [employer], Marital status: S, Substance use: No Diet: carb and low sodium , Exercise: yes  Health Maintenance  Topic Date Due   COVID-19 Vaccine (1) Never done   Hepatitis C Screening  Never done   INFLUENZA VACCINE  05/28/2021 (Originally 09/28/2020)   TETANUS/TDAP  11/18/2021 (Originally 08/30/1997)   HIV Screening  Completed   HPV VACCINES  Aged Out     Past Medical History:  Diagnosis Date   Hypercholesteremia    Hypertension 2014   Social History   Socioeconomic History   Marital status: Single    Spouse name: Not on file   Number of children: Not on file   Years of education: Not on file   Highest education level: Not on file  Occupational History   Not on file  Tobacco Use   Smoking status: Every Day    Packs/day: 0.50    Types: Cigarettes    Start date: 02/28/1993   Smokeless tobacco: Never  Substance and Sexual Activity   Alcohol use: No    Alcohol/week: 0.0 standard drinks    Comment: EOD   Drug use: No   Sexual activity: Yes  Other Topics Concern   Not on file  Social History Narrative   Not on file   Social Determinants of Health   Financial Resource Strain: Not on file  Food Insecurity: Not on file  Transportation Needs: Not on file  Physical Activity: Not on file  Stress: Not on file  Social Connections: Not on file  Intimate Partner Violence: Not on file   No past surgical history on file. Family History  Problem Relation Age of Onset   Cancer Father    Hypertension Maternal Grandmother    Hypertension Maternal Grandfather     Current Outpatient Medications:    amLODipine (NORVASC) 10 MG tablet, Take 1 tablet (10 mg total) by mouth daily., Disp: 90 tablet, Rfl: 1    hydrochlorothiazide (HYDRODIURIL) 25 MG tablet, TAKE 1 TABLET BY MOUTH ONCE DAILY IN THE MORNING, Disp: 90 tablet, Rfl: 1   losartan (COZAAR) 50 MG tablet, TAKE 1 TABLET (50 MG TOTAL) BY MOUTH DAILY., Disp: 90 tablet, Rfl: 1 Outpatient Encounter Medications as of 04/08/2021  Medication Sig   amLODipine (NORVASC) 10 MG tablet Take 1 tablet (10 mg total) by mouth daily.   hydrochlorothiazide (HYDRODIURIL) 25 MG tablet TAKE 1 TABLET BY MOUTH ONCE DAILY IN THE MORNING   losartan (COZAAR) 50 MG tablet TAKE 1 TABLET (50 MG TOTAL) BY MOUTH DAILY.   No facility-administered encounter medications on file as of 04/08/2021.    No Known Allergies   ROS: Review of Systems A comprehensive review of systems was negative.    Physical exam BP (!) 145/84 (BP Location: Right Arm, Patient Position: Sitting, Cuff Size: Normal)    Pulse 61    Temp 97.9 F (36.6 C) (Oral)    Ht 6' (1.829 m)    Wt 193 lb 9.6 oz (87.8 kg)    SpO2 97%    BMI 26.26 kg/m  General appearance: alert, cooperative, appears stated age, and no distress Head: Normocephalic, without obvious abnormality, atraumatic Eyes: conjunctivae/corneas clear. PERRL, EOM's intact. Fundi benign. Ears: normal TM's and external ear  canals both ears Neck: no adenopathy, no carotid bruit, no JVD, supple, symmetrical, trachea midline, and thyroid not enlarged, symmetric, no tenderness/mass/nodules Back: symmetric, no curvature. ROM normal. No CVA tenderness. Lungs: clear to auscultation bilaterally Heart: regular rate and rhythm, S1, S2 normal, no murmur, click, rub or gallop Abdomen: soft, non-tender; bowel sounds normal; no masses,  no organomegaly Extremities: extremities normal, atraumatic, no cyanosis or edema Pulses: 2+ and symmetric Skin: Skin color, texture, turgor normal. No rashes or lesions Lymph nodes: Cervical, supraclavicular, and axillary nodes normal. Neurologic: Alert and oriented X 3, normal strength and tone. Normal symmetric reflexes.  Normal coordination and gait   [name] was seen today for annual exam.  Diagnoses and all orders for this visit:  Prediabetes Monitor foods that are high in carbohydrates are the following rice, potatoes, breads, sugars, and pastas.  Reduction in the intake (eating) will assist in lowering your blood sugars.  -     HgB A1c 5.9  -     CBC with Differential  Annual physical exam -     CBC with Differential  Hypertension, unspecified type Counseled on blood pressure goal of less than 130/80, low-sodium, DASH diet, medication compliance, 150 minutes of moderate intensity exercise per week. Discussed medication compliance, adverse effects.  -     hydrochlorothiazide (HYDRODIURIL) 25 MG tablet; TAKE 1 TABLET BY MOUTH ONCE DAILY IN THE MORNING -     amLODipine (NORVASC) 10 MG tablet; Take 1 tablet (10 mg total) by mouth daily.  Screening examination for STD (sexually transmitted disease) -     Cytology (oral, anal, urethral) ancillary only -     HIV antibody (with reflex)  Counseled on healthy lifestyle choices, including diet (rich in fruits, vegetables and lean meats and low in salt and simple carbohydrates) and exercise (at least 30 minutes of moderate physical activity daily).  Patient to follow up in 1 year for annual exam or sooner if needed.  The above assessment and management plan was discussed with the patient. The patient verbalized understanding of and has agreed to the management plan. Patient is aware to call the clinic if symptoms persist or worsen. Patient is aware when to return to the clinic for a follow-up visit. Patient educated on when it is appropriate to go to the emergency department.   This note has been created with Surveyor, quantity. Any transcriptional errors are unintentional.   Thomas Perna, NP 04/08/2021, 10:54 AM

## 2021-04-09 LAB — CBC WITH DIFFERENTIAL/PLATELET
Basophils Absolute: 0.1 10*3/uL (ref 0.0–0.2)
Basos: 1 %
EOS (ABSOLUTE): 0.1 10*3/uL (ref 0.0–0.4)
Eos: 1 %
Hematocrit: 44.8 % (ref 37.5–51.0)
Hemoglobin: 15.1 g/dL (ref 13.0–17.7)
Immature Grans (Abs): 0.1 10*3/uL (ref 0.0–0.1)
Immature Granulocytes: 1 %
Lymphocytes Absolute: 2.2 10*3/uL (ref 0.7–3.1)
Lymphs: 28 %
MCH: 30.7 pg (ref 26.6–33.0)
MCHC: 33.7 g/dL (ref 31.5–35.7)
MCV: 91 fL (ref 79–97)
Monocytes Absolute: 0.5 10*3/uL (ref 0.1–0.9)
Monocytes: 7 %
Neutrophils Absolute: 4.8 10*3/uL (ref 1.4–7.0)
Neutrophils: 62 %
Platelets: 193 10*3/uL (ref 150–450)
RBC: 4.92 x10E6/uL (ref 4.14–5.80)
RDW: 14.4 % (ref 11.6–15.4)
WBC: 7.7 10*3/uL (ref 3.4–10.8)

## 2021-04-09 LAB — HIV ANTIBODY (ROUTINE TESTING W REFLEX): HIV Screen 4th Generation wRfx: NONREACTIVE

## 2021-04-12 LAB — CYTOLOGY, (ORAL, ANAL, URETHRAL) ANCILLARY ONLY
Chlamydia: NEGATIVE
Comment: NEGATIVE
Comment: NEGATIVE
Comment: NORMAL
Neisseria Gonorrhea: NEGATIVE
Trichomonas: NEGATIVE

## 2021-04-15 ENCOUNTER — Other Ambulatory Visit: Payer: Self-pay

## 2021-04-21 ENCOUNTER — Other Ambulatory Visit: Payer: Self-pay

## 2021-05-28 ENCOUNTER — Other Ambulatory Visit: Payer: Self-pay

## 2021-06-04 IMAGING — CT CT ABD-PELV W/ CM
2 of 5 series · 16 of 46 positions shown, 18 images · IV contrast (Omni 300)
Comparison: None.

CLINICAL DATA: Generalized abdominal pain with perineal tenderness.
Possible abscess. Rectal pain several days.

EXAM:
CT ABDOMEN AND PELVIS WITH CONTRAST
TECHNIQUE: Multidetector CT imaging of the abdomen and pelvis was performed
using the standard protocol following bolus administration of
intravenous contrast.
CONTRAST:  100mL OMNIPAQUE IOHEXOL 300 MG/ML  SOLN

[Series 3: a/p w/ 5mm · axial · 0.90mm/px · z∈[-595,-90]mm · 13 of 113 slices shown, 15 images]
[im 6/113  soft-tissue]
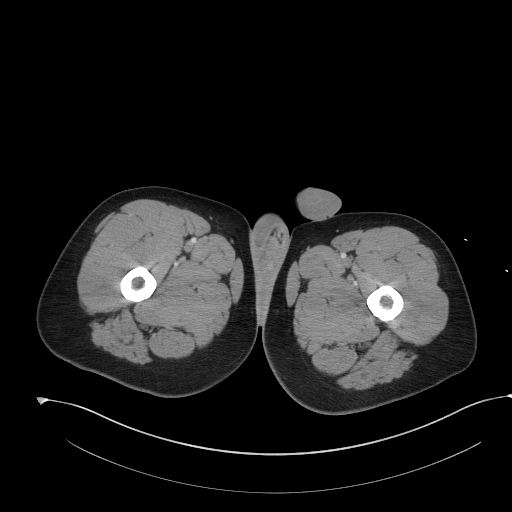
[im 6/113  bone]
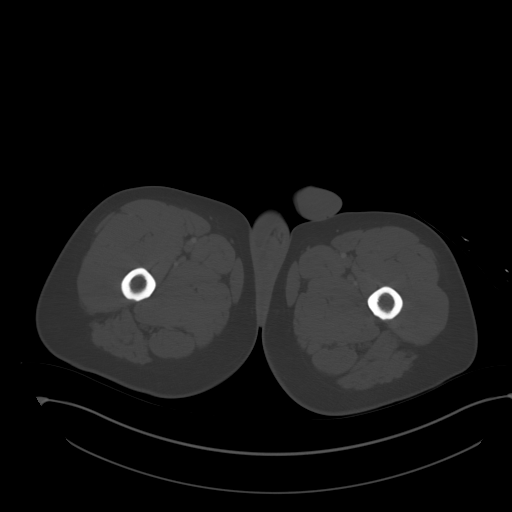
[im 17/113  soft-tissue]
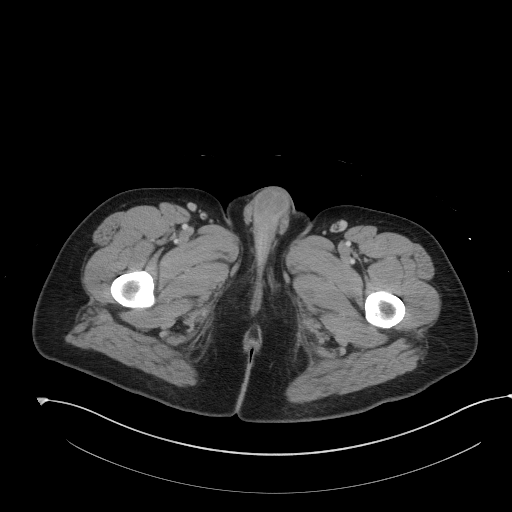
[im 22/113  soft-tissue]
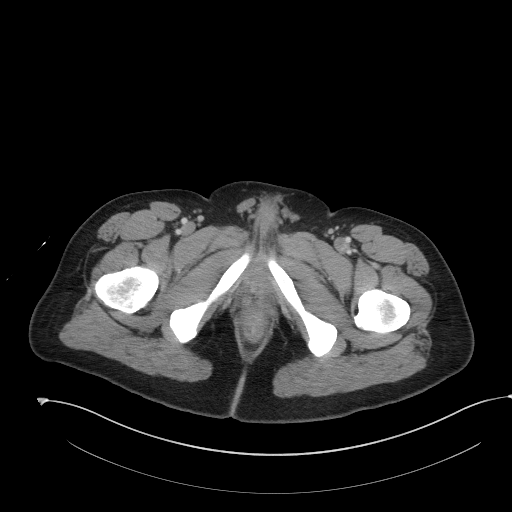
[im 33/113  soft-tissue]
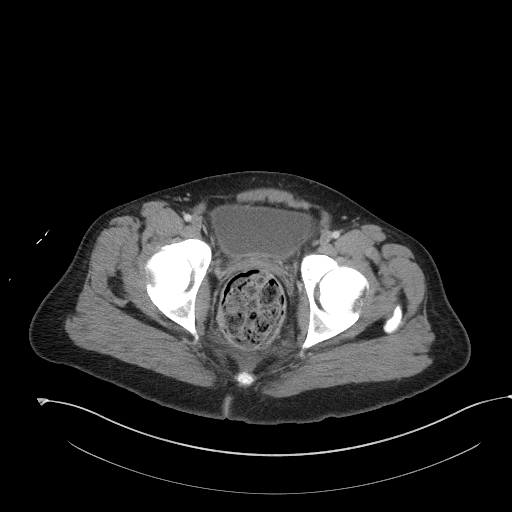
[im 38/113  soft-tissue]
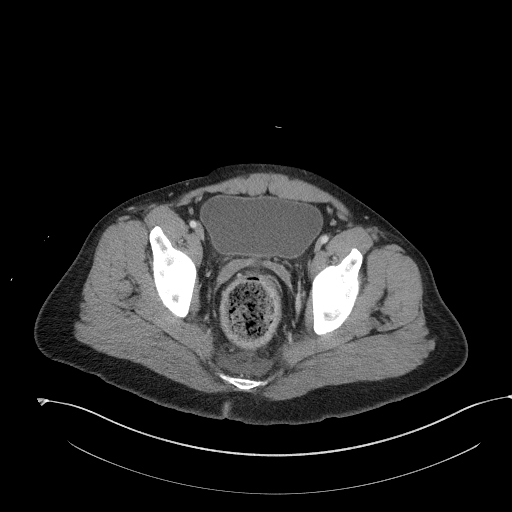
[im 49/113  soft-tissue]
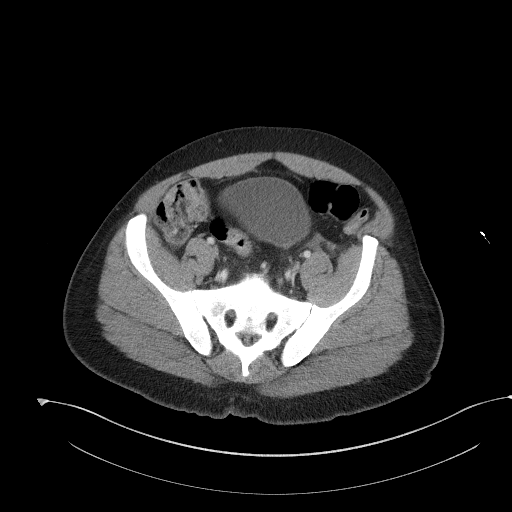
[im 59/113  soft-tissue]
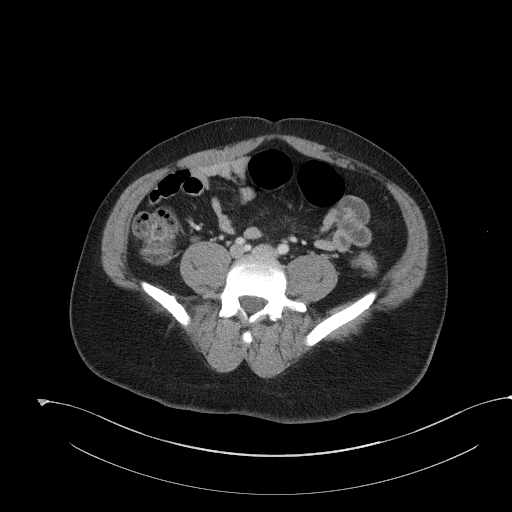
[im 65/113  soft-tissue]
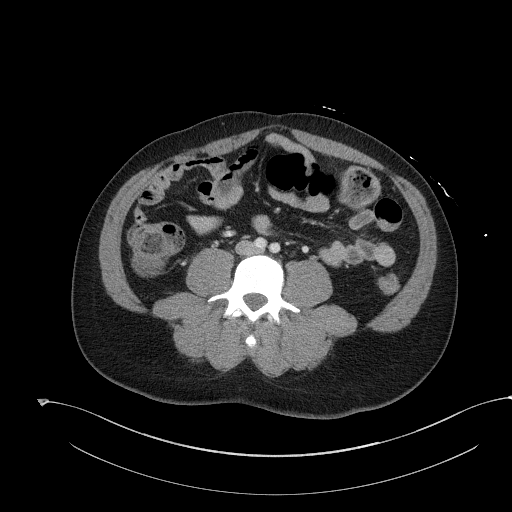
[im 75/113  soft-tissue]
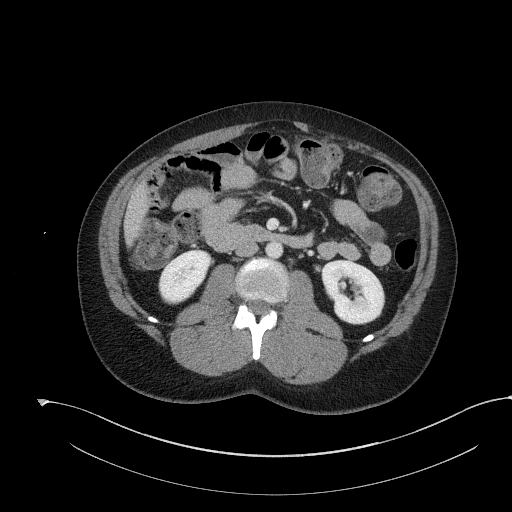
[im 75/113  bone]
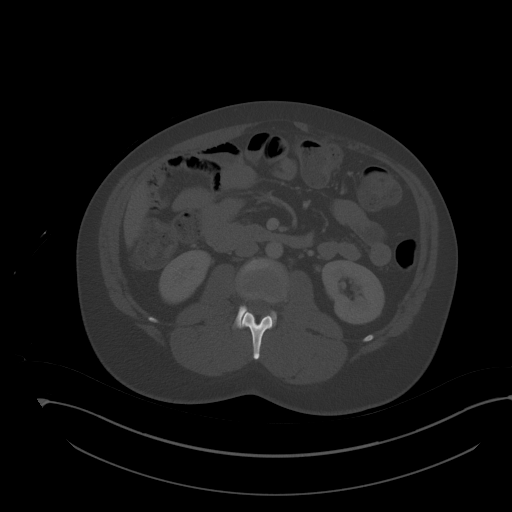
[im 81/113  soft-tissue]
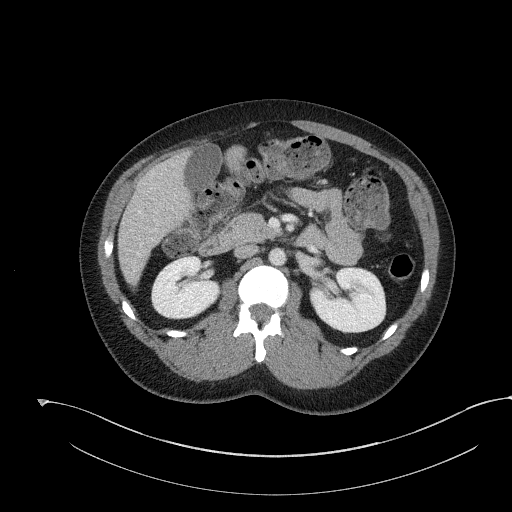
[im 91/113  soft-tissue]
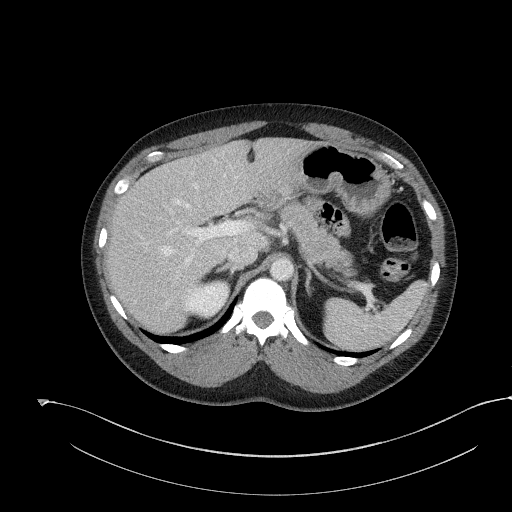
[im 97/113  soft-tissue]
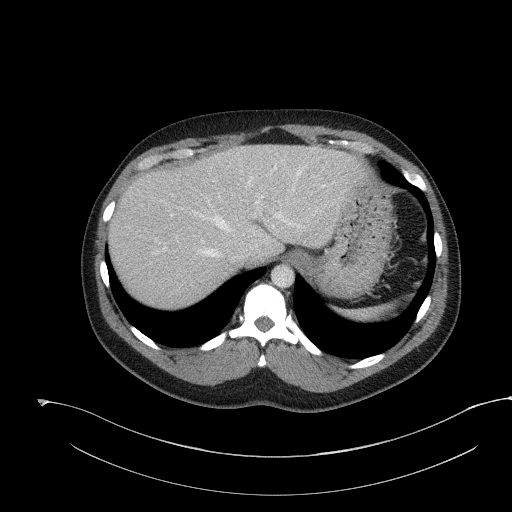
[im 107/113  soft-tissue]
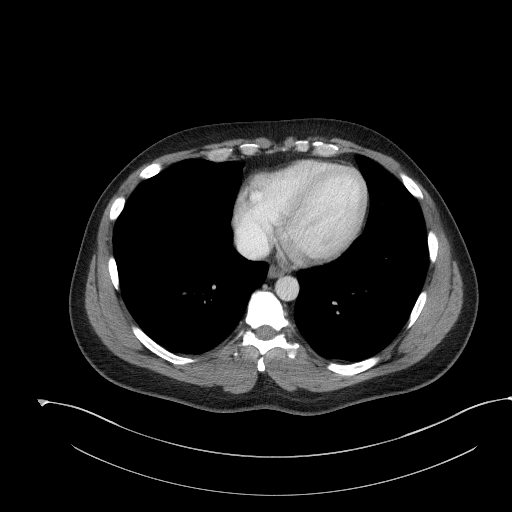

[Series 6: a/p w/ cor · coronal · 0.77mm/px · 3 of 150 slices shown]
[im 50/150  soft-tissue]
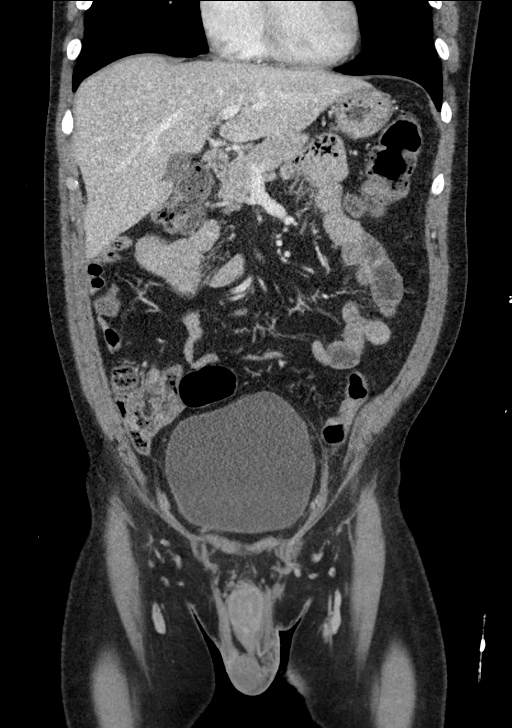
[im 67/150  soft-tissue]
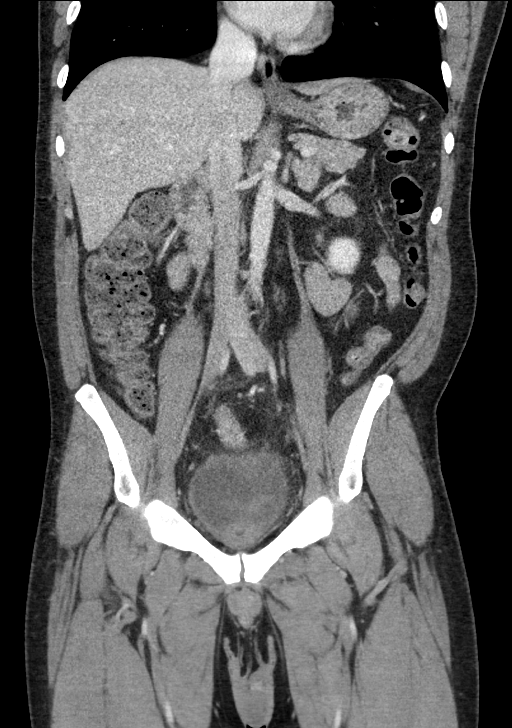
[im 83/150  soft-tissue]
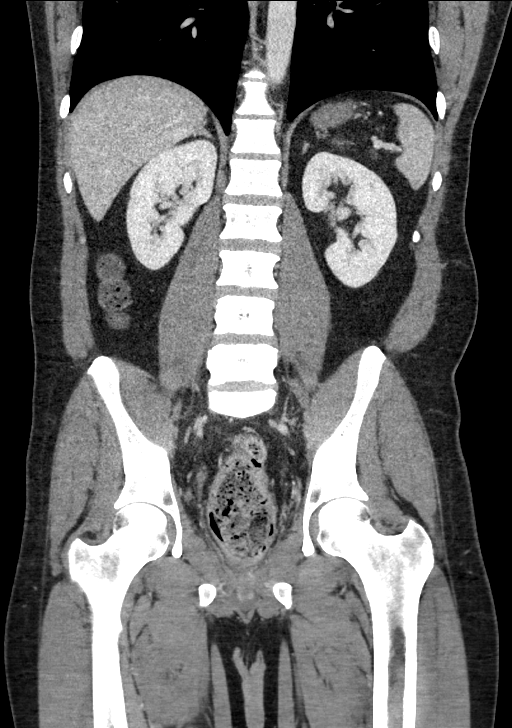

[16 of 46 positions shown; findings below may reference images not displayed]

FINDINGS: Lower chest: Lung bases are clear.

Hepatobiliary: Liver, gallbladder and biliary tree are normal.

Pancreas: Normal.

Spleen: Normal.

Adrenals/Urinary Tract: Adrenal glands are normal. Kidneys are
normal in size without hydronephrosis or nephrolithiasis. Ureters
and bladder are normal.

Stomach/Bowel: Stomach and small bowel are normal. Appendix is
normal. Minimal fecal retention throughout the colon. Moderate fecal
retention over the rectum with mild associated wall thickening of
the rectum. Small amount of fluid in the presacral space. No
evidence of perirectal/perianal abscess. No focal inflammatory
process over the perirectal/perianal region nor over the soft
tissues of the perineum.

Vascular/Lymphatic: Normal.

Reproductive: Normal.

Other: None.

Musculoskeletal: Normal.
IMPRESSION: 1. No evidence of perirectal, perianal or perineal abscess. There is
moderate fecal retention over the rectum with mild rectal wall
thickening with minimal fluid in the presacral space. Findings may
be due to mild proctitis.

## 2021-07-21 ENCOUNTER — Other Ambulatory Visit: Payer: Self-pay

## 2021-07-22 ENCOUNTER — Other Ambulatory Visit: Payer: Self-pay

## 2021-09-06 ENCOUNTER — Other Ambulatory Visit (INDEPENDENT_AMBULATORY_CARE_PROVIDER_SITE_OTHER): Payer: Self-pay | Admitting: Primary Care

## 2021-09-06 ENCOUNTER — Other Ambulatory Visit: Payer: Self-pay

## 2021-09-06 MED ORDER — LOSARTAN POTASSIUM 50 MG PO TABS
ORAL_TABLET | Freq: Every day | ORAL | 1 refills | Status: DC
  Filled 2021-09-06: qty 30, 30d supply, fill #0

## 2021-09-07 ENCOUNTER — Other Ambulatory Visit: Payer: Self-pay

## 2021-10-06 ENCOUNTER — Ambulatory Visit (INDEPENDENT_AMBULATORY_CARE_PROVIDER_SITE_OTHER): Payer: Self-pay | Admitting: Primary Care

## 2021-10-06 ENCOUNTER — Encounter (INDEPENDENT_AMBULATORY_CARE_PROVIDER_SITE_OTHER): Payer: Self-pay | Admitting: Primary Care

## 2021-10-06 ENCOUNTER — Other Ambulatory Visit: Payer: Self-pay

## 2021-10-06 VITALS — BP 132/77 | HR 57 | Temp 97.8°F | Ht 72.0 in | Wt 187.0 lb

## 2021-10-06 DIAGNOSIS — Z7141 Alcohol abuse counseling and surveillance of alcoholic: Secondary | ICD-10-CM

## 2021-10-06 DIAGNOSIS — I10 Essential (primary) hypertension: Secondary | ICD-10-CM

## 2021-10-06 DIAGNOSIS — F17210 Nicotine dependence, cigarettes, uncomplicated: Secondary | ICD-10-CM

## 2021-10-06 DIAGNOSIS — F17200 Nicotine dependence, unspecified, uncomplicated: Secondary | ICD-10-CM

## 2021-10-06 DIAGNOSIS — Z760 Encounter for issue of repeat prescription: Secondary | ICD-10-CM

## 2021-10-06 MED ORDER — AMLODIPINE BESYLATE 10 MG PO TABS
10.0000 mg | ORAL_TABLET | Freq: Every day | ORAL | 1 refills | Status: DC
  Filled 2021-10-06 – 2021-10-20 (×2): qty 30, 30d supply, fill #0
  Filled 2021-11-29: qty 30, 30d supply, fill #1
  Filled 2022-01-18: qty 30, 30d supply, fill #2
  Filled 2022-04-01 – 2022-04-08 (×2): qty 30, 30d supply, fill #3
  Filled 2022-06-03: qty 30, 30d supply, fill #4
  Filled 2022-07-07: qty 30, 30d supply, fill #5

## 2021-10-06 MED ORDER — HYDROCHLOROTHIAZIDE 25 MG PO TABS
ORAL_TABLET | Freq: Every morning | ORAL | 1 refills | Status: DC
  Filled 2021-10-06 – 2021-10-20 (×2): qty 30, 30d supply, fill #0
  Filled 2021-11-29: qty 30, 30d supply, fill #1
  Filled 2022-01-18: qty 30, 30d supply, fill #2
  Filled 2022-04-01 – 2022-04-08 (×2): qty 30, 30d supply, fill #3
  Filled 2022-06-03: qty 30, 30d supply, fill #4
  Filled 2022-07-07: qty 30, 30d supply, fill #5

## 2021-10-06 MED ORDER — LOSARTAN POTASSIUM 50 MG PO TABS
ORAL_TABLET | Freq: Every day | ORAL | 1 refills | Status: DC
  Filled 2021-10-06 – 2021-10-20 (×2): qty 30, 30d supply, fill #0
  Filled 2021-11-29: qty 30, 30d supply, fill #1
  Filled 2022-01-18: qty 30, 30d supply, fill #2
  Filled 2022-04-01 – 2022-04-08 (×2): qty 30, 30d supply, fill #3
  Filled 2022-06-03: qty 30, 30d supply, fill #4
  Filled 2022-07-07: qty 30, 30d supply, fill #5

## 2021-10-06 NOTE — Patient Instructions (Signed)
Community Resources  Advocacy/Legal Legal Aid [place]:  1-866-219-5262  /  336-272-0148  Family Justice Center:  336-641-7233  [facility] [place] 24-hr Crisis line:  336-273-7273  Women's Resource Center, [place]:  336-275-6090  Court Watch (custody):  336-275-2346  [place] [facility]:   336-279-9299    Baby & Breastfeeding Car Seat Inspection @ Various [place] Fire Depts.- call 336-373-2177  [place] [facility]  336-832-6860  [facility] 336-878-6712  WIC: 336-641-3663 ([place]);  336-641-7571 ([place])  La Leche League:  1-877-452-5321   Childcare [place] [facility]: 336-369-5097 ([place]) / 336-887-8224 ([place])  - Child Care Resources/ Referrals/ Scholarships  - Head Start/ Early Head Start (call or apply online)  [place] DHHS: [place] [facility] :  1-800-859-0829 / 336-274-5437   Employment / Job Search [facility] [place]: 336-275-6090 / [address]  [place] [employer] (JobLink): 336-373-5922 ([place]) / 336-882-4141 ([place])  [facility] Community Resource/ Career Center: 336-275-9801 / 336-282-7307  [facility] Job & Career Center: 336-373-3764  DHHS Work First: 336-641-3447 ([place]) / 336-641-3447 ([place])  [facility] [place]:  336-676-5871   Financial Assistance [place] [facility]:  336-553-2657  [employer]: 336-235-0368  [employer] (furniture):  336-370-4002  [place] [facility]: 336-373-4264  Low Income Energy Assistance  336-641-3000   Food Assistance DHHS- SNAP/ Food Stamps: 336-641-4588  WIC: [place]- 336-641-3663 ;  [place] 336-641-7571  Little Green Book- Free Meals  Little Blue Book- Free Food Pantries  During the summer, text "FOOD" to 877877   General Health / Clinics (Adults) Orange Card (for Adults) through [facility]: (336) 895-4900  [facility]:   336-832-8035  [facility]:   336-832-4444  Health Department:  336-641-3245  [facility]:  336-415-3877 / 336-641-2100  [facility] [place]:   336-373-0678  [facility]:   336-334-4822 x 50251   Housing [place] Housing Coalition:   336-691-9521  [place] Housing Authority:  336-275-8501  Affordable Housing Managemnt:  336-273-0568   Immigrant/ Refugee Center for [place]ians (UNCG):  336-256-1065  [facility]:  336-379-0037  [employer] [facility]:  336-937-4701  [employer] Services:  336-617-0381  African Services Coalition:  336-574-2677   LGBTQ YouthSAFE  www.youthsafe[place].org  PFLAG  336-541-6754 / info@[place].org  The Trevor Project:  1-866-488-7386   Mental Health/ Substance Use [facility]  336-387-6161  [facility]:  336-832-9700 or 1-800-711-2635  [facility] Circle of Care:  336-271-5888  [facility]:  336-294-1349  [facility]:  336-542-2884  [facility] (walk-ins)  336-676-6840 / [address]  [place]:  800-449-1287  Alcoholics Anonymous:  336-854-4278  Narcotics Anonymous:  800-365-1036  Quit Smoking Hotline:  800-QUIT-NOW (800-784-8669)   Parenting Children's Home Society:  800-632-1400  [place]: Education Center & Support Groups:  336-832-6682  YWCA: 336-273-3461  [employer]: Bringing Out the Best:  336-334-3120               Thriving at Three (Hispanic families): 336-256-1066  Healthy Start ([facility]):  336-387-6161 x2288  Parents as Teachers:  336-691-0024  [place] [facility]- Learning Together (Immigrants): 336-369-5001   Poison Control 800-222-1222  Sports & Recreation [employer] Open Doors Application: ymcanwnc.org/join/open-doors-financial-assistance/  City of [place] Recreation Centers: http://www.[place].gov/index.aspx?page=3615   Special Needs Family Support Network:  336-832-6507  Autism Society of [place]:   336-333-0197 x1402 or x1412 /  800-785-1035  TEACCH :  336-334-5773     ARC of [place]:  336-373-1076  Children's Developmental Service Agency (CDSA):  336-334-5601  CC4C (Care Coordination for Children):  336-641-7641   Transportation Medicaid Transportation: 336-641-4848 to apply   Transit Authority: 336-335-6499 (reduced-fare bus ID to Medicaid/ Medicare/ Orange Card)  SCAT Paratransit services: Eligible riders only, call 336-333-6589 for application   Tutoring/Mentoring Black Child Development Institute: 336-230-2138  Big Brothers/ Big Sisters: 336-378-9100 ([place])  336-882-4167 ([place])  ACES through child's school: 336-370-2321  [employer] Achievers: contact your local Y  SHIELD Mentor Program: 336-337-2771   

## 2021-10-06 NOTE — Progress Notes (Signed)
[facility]   Thomas Mclean. is a 41 y.o. male presents for hypertension evaluation, Denies shortness of breath, headaches, chest pain or lower extremity edema, sudden onset, vision changes, unilateral weakness, dizziness, paresthesias. He does mention smoking is not that bad but her is concern with drinking.   Patient reports adherence with medications.  Dietary habits include: decrease sodium  Exercise habits include:walking daily  Family / Social history: Uncle fraternal MI   Past Medical History:  Diagnosis Date   Hypercholesteremia    Hypertension 2014   No past surgical history on file. No Known Allergies Current Outpatient Medications on File Prior to Visit  Medication Sig Dispense Refill   amLODipine (NORVASC) 10 MG tablet Take 1 tablet (10 mg total) by mouth daily. 90 tablet 1   hydrochlorothiazide (HYDRODIURIL) 25 MG tablet TAKE 1 TABLET BY MOUTH ONCE DAILY IN THE MORNING 90 tablet 1   losartan (COZAAR) 50 MG tablet TAKE 1 TABLET (50 MG TOTAL) BY MOUTH DAILY. 90 tablet 1   No current facility-administered medications on file prior to visit.   Social History   Socioeconomic History   Marital status: Single    Spouse name: Not on file   Number of children: Not on file   Years of education: Not on file   Highest education level: Not on file  Occupational History   Not on file  Tobacco Use   Smoking status: Every Day    Packs/day: 0.50    Types: Cigarettes    Start date: 02/28/1993   Smokeless tobacco: Never  Substance and Sexual Activity   Alcohol use: No    Alcohol/week: 0.0 standard drinks of alcohol    Comment: EOD   Drug use: No   Sexual activity: Yes  Other Topics Concern   Not on file  Social History Narrative   Not on file   Social Determinants of Health   Financial Resource Strain: Not on file  Food Insecurity: Not on file  Transportation Needs: Not on file  Physical Activity: Not on file  Stress: Not on file   Social Connections: Not on file  Intimate Partner Violence: Not on file   Family History  Problem Relation Age of Onset   Cancer Father    Hypertension Maternal Grandmother    Hypertension Maternal Grandfather      OBJECTIVE:  Vitals:   10/06/21 0844  BP: 132/77  Pulse: (!) 57  Temp: 97.8 F (36.6 C)  TempSrc: Oral  SpO2: 96%  Weight: 187 lb (84.8 kg)  Height: 6' (1.829 m)    Physical Exam Vitals reviewed.  Constitutional:      Appearance: Normal appearance. He is normal weight.  HENT:     Head: Normocephalic.     Right Ear: External ear normal.     Left Ear: External ear normal.     Nose: Nose normal.  Eyes:     Extraocular Movements: Extraocular movements intact.     Pupils: Pupils are equal, round, and reactive to light.  Cardiovascular:     Rate and Rhythm: Normal rate and regular rhythm.  Pulmonary:     Effort: Pulmonary effort is normal.     Breath sounds: Normal breath sounds.  Abdominal:     General: Bowel sounds are normal.     Palpations: Abdomen is soft.  Musculoskeletal:        General: Normal range of motion.     Cervical back: Normal range of motion.  Skin:  General: Skin is warm and dry.  Neurological:     Mental Status: He is oriented to person, place, and time.  Psychiatric:        Mood and Affect: Mood normal.        Behavior: Behavior normal.        Thought Content: Thought content normal.      ROS Comprehensive ROS Pertinent positive and negative noted in HPI   Last 3 Office BP readings: BP Readings from Last 3 Encounters:  10/06/21 132/77  04/08/21 (!) 145/84  11/18/20 (!) 145/82    BMET    Component Value Date/Time   NA 138 12/02/2018 0846   NA 137 11/16/2017 0900   K 3.7 12/02/2018 0846   CL 103 12/02/2018 0846   CO2 24 12/02/2018 0846   GLUCOSE 113 (H) 12/02/2018 0846   BUN 8 12/02/2018 0846   BUN 10 11/16/2017 0900   CREATININE 0.77 12/02/2018 0846   CALCIUM 9.6 12/02/2018 0846   GFRNONAA >60 12/02/2018  0846   GFRAA >60 12/02/2018 0846    Renal function: CrCl cannot be calculated (Patient's most recent lab result is older than the maximum 21 days allowed.).  Clinical ASCVD: No  The 10-year ASCVD risk score (Arnett DK, et al., 2019) is: 3%   Values used to calculate the score:     Age: 88 years     Sex: Male     Is Non-Hispanic African American: No     Diabetic: No     Tobacco smoker: Yes     Systolic Blood Pressure: 132 mmHg     Is BP treated: Yes     HDL Cholesterol: 50 mg/dL     Total Cholesterol: 138 mg/dL  ASCVD risk factors include- [place]   ASSESSMENT & PLAN: [name] was seen today for hypertension and medication refill.  Diagnoses and all orders for this visit:  Medication refill -     losartan (COZAAR) 50 MG tablet; TAKE 1 TABLET (50 MG TOTAL) BY MOUTH DAILY. -     hydrochlorothiazide (HYDRODIURIL) 25 MG tablet; TAKE 1 TABLET BY MOUTH ONCE DAILY IN THE MORNING -     amLODipine (NORVASC) 10 MG tablet; Take 1 tablet (10 mg total) by mouth daily.  Hypertension, unspecified type Blood pressure control is controlled by including reduced dietary sodium, excerning , weight reduction and adequate sleep. Also, educated patient about the risk for cardiovascular events, stroke and heart attack. Also counseled patient about the importance of medication adherence. If you participate in smoking, it is important to stop using tobacco as this will increase the risks associated with uncontrolled blood pressure.   -     losartan (COZAAR) 50 MG tablet; TAKE 1 TABLET (50 MG TOTAL) BY MOUTH DAILY. -     hydrochlorothiazide (HYDRODIURIL) 25 MG tablet; TAKE 1 TABLET BY MOUTH ONCE DAILY IN THE MORNING -     amLODipine (NORVASC) 10 MG tablet; Take 1 tablet (10 mg total) by mouth daily.  Patient is adherent with current medications.    Goal BP:  For patients younger than 60: Goal BP < 130/80. For patients 60 and older: Goal BP < 140/90. For patients with diabetes: Goal BP < 130/80. Your  most recent BP: 132/77  Minimize salt intake. Minimize alcohol intake   Alcohol cessation counseling Provide resources on AVS and will refer to CSW   Tobacco dependence - I have recommended complete cessation of tobacco use. I have discussed various options available for assistance with tobacco  cessation including over the counter methods (Nicotine gum, patch and lozenges). We also discussed prescription options (Chantix, Nicotine Inhaler / Nasal Spray). The patient is not interested in pursuing any prescription tobacco cessation options at this time. - Patient declines at this time.  This note has been created with Education officer, environmental. Any transcriptional errors are unintentional.   Grayce Sessions, NP 10/06/2021, 8:48 AM

## 2021-10-07 LAB — CMP14+EGFR
ALT: 15 IU/L (ref 0–44)
AST: 20 IU/L (ref 0–40)
Albumin/Globulin Ratio: 2 (ref 1.2–2.2)
Albumin: 4.5 g/dL (ref 4.1–5.1)
Alkaline Phosphatase: 53 IU/L (ref 44–121)
BUN/Creatinine Ratio: 14 (ref 9–20)
BUN: 12 mg/dL (ref 6–24)
Bilirubin Total: 0.3 mg/dL (ref 0.0–1.2)
CO2: 24 mmol/L (ref 20–29)
Calcium: 9.5 mg/dL (ref 8.7–10.2)
Chloride: 100 mmol/L (ref 96–106)
Creatinine, Ser: 0.85 mg/dL (ref 0.76–1.27)
Globulin, Total: 2.2 g/dL (ref 1.5–4.5)
Glucose: 104 mg/dL — ABNORMAL HIGH (ref 70–99)
Potassium: 4.1 mmol/L (ref 3.5–5.2)
Sodium: 138 mmol/L (ref 134–144)
Total Protein: 6.7 g/dL (ref 6.0–8.5)
eGFR: 111 mL/min/{1.73_m2} (ref >59)

## 2021-10-07 LAB — LIPID PANEL
Chol/HDL Ratio: 3.3 ratio (ref 0.0–5.0)
Cholesterol, Total: 195 mg/dL (ref 100–199)
HDL: 60 mg/dL (ref >39)
LDL Chol Calc (NIH): 114 mg/dL — ABNORMAL HIGH (ref 0–99)
Triglycerides: 118 mg/dL (ref 0–149)
VLDL Cholesterol Cal: 21 mg/dL (ref 5–40)

## 2021-10-12 ENCOUNTER — Other Ambulatory Visit: Payer: Self-pay

## 2021-10-20 ENCOUNTER — Other Ambulatory Visit: Payer: Self-pay

## 2021-10-27 ENCOUNTER — Telehealth (INDEPENDENT_AMBULATORY_CARE_PROVIDER_SITE_OTHER): Payer: Self-pay | Admitting: Licensed Clinical Social Worker

## 2021-10-27 NOTE — Telephone Encounter (Signed)
LCSWA called patient today to introduce herself and to assess patients' mental health needs. Patient was referred by PCP for alcohol concerns. Patient was interested programs and resources. Patient wast at work. LCSWA agreed to sending him information on MyChart and to reach back out if he had any questions. MyChart message is below:   Hey I would first call: [facility] [address] -[address] [place], [place] [phone] or [phone] 24hrs a day. 24hours they are open for walk-ins. Free for those who do not have insurance. Patient would need to bring any medications they may be taking with them.   Once you call them before arriving they can tell you what's best for you to do.  Below are some additional resources. Links to AA meetings that are free! Just show up! And if you don't like it. Try another one.  tinituscare.com https://www.triadalanon.org/meetings/?tsml-day=any https://www.young.biz/ HitProtect.dk  Please let me know if you have any questions or need more support.

## 2021-11-29 ENCOUNTER — Other Ambulatory Visit: Payer: Self-pay

## 2022-01-18 ENCOUNTER — Other Ambulatory Visit: Payer: Self-pay

## 2022-04-01 ENCOUNTER — Other Ambulatory Visit: Payer: Self-pay

## 2022-04-07 ENCOUNTER — Other Ambulatory Visit: Payer: Self-pay

## 2022-04-08 ENCOUNTER — Other Ambulatory Visit: Payer: Self-pay

## 2022-04-08 ENCOUNTER — Ambulatory Visit (INDEPENDENT_AMBULATORY_CARE_PROVIDER_SITE_OTHER): Payer: Commercial Managed Care - HMO | Admitting: Primary Care

## 2022-04-11 ENCOUNTER — Other Ambulatory Visit: Payer: Self-pay

## 2022-06-03 ENCOUNTER — Other Ambulatory Visit: Payer: Self-pay

## 2022-07-07 ENCOUNTER — Other Ambulatory Visit: Payer: Self-pay

## 2022-07-08 ENCOUNTER — Other Ambulatory Visit: Payer: Self-pay

## 2022-08-17 ENCOUNTER — Other Ambulatory Visit (INDEPENDENT_AMBULATORY_CARE_PROVIDER_SITE_OTHER): Payer: Self-pay | Admitting: Primary Care

## 2022-08-17 ENCOUNTER — Other Ambulatory Visit: Payer: Self-pay

## 2022-08-17 DIAGNOSIS — I10 Essential (primary) hypertension: Secondary | ICD-10-CM

## 2022-08-17 DIAGNOSIS — Z760 Encounter for issue of repeat prescription: Secondary | ICD-10-CM

## 2022-08-17 NOTE — Telephone Encounter (Unsigned)
Copied from CRM [id number]. Topic: General - Other >> Aug 17, 2022  2:45 PM Everette C wrote: Reason for CRM: Medication Refill - Medication: Rx #: [id number]  hydrochlorothiazide (HYDRODIURIL) 25 MG tablet [[id number]]   Rx #: [id number]  losartan (COZAAR) 50 MG tablet [[id number]]   Rx #: [id number]  amLODipine (NORVASC) 10 MG tablet [[id number]]    Has the patient contacted their pharmacy? Yes.   (Agent: If no, request that the patient contact the pharmacy for the refill. If patient does not wish to contact the pharmacy document the reason why and proceed with request.) (Agent: If yes, when and what did the pharmacy advise?)  Preferred Pharmacy (with phone number or street name): [facility] [address], [address] [place] [address] Phone: [phone] Fax: [phone] Hours: M-F 7:30a-6:00p   Has the patient been seen for an appointment in the last year OR does the patient have an upcoming appointment? Yes.    Agent: Please be advised that RX refills may take up to 3 business days. We ask that you follow-up with your pharmacy.

## 2022-08-18 ENCOUNTER — Ambulatory Visit (INDEPENDENT_AMBULATORY_CARE_PROVIDER_SITE_OTHER): Payer: 59 | Admitting: Primary Care

## 2022-08-18 ENCOUNTER — Encounter (INDEPENDENT_AMBULATORY_CARE_PROVIDER_SITE_OTHER): Payer: Self-pay | Admitting: Primary Care

## 2022-08-18 ENCOUNTER — Other Ambulatory Visit: Payer: Self-pay

## 2022-08-18 VITALS — BP 148/86 | HR 61 | Resp 16 | Ht 71.0 in | Wt 200.0 lb

## 2022-08-18 DIAGNOSIS — Z760 Encounter for issue of repeat prescription: Secondary | ICD-10-CM

## 2022-08-18 DIAGNOSIS — I10 Essential (primary) hypertension: Secondary | ICD-10-CM | POA: Diagnosis not present

## 2022-08-18 DIAGNOSIS — Z6827 Body mass index (BMI) 27.0-27.9, adult: Secondary | ICD-10-CM | POA: Diagnosis not present

## 2022-08-18 DIAGNOSIS — E663 Overweight: Secondary | ICD-10-CM

## 2022-08-18 DIAGNOSIS — Z8249 Family history of ischemic heart disease and other diseases of the circulatory system: Secondary | ICD-10-CM

## 2022-08-18 DIAGNOSIS — Z1159 Encounter for screening for other viral diseases: Secondary | ICD-10-CM | POA: Diagnosis not present

## 2022-08-18 MED ORDER — HYDROCHLOROTHIAZIDE 25 MG PO TABS
25.0000 mg | ORAL_TABLET | Freq: Every morning | ORAL | 1 refills | Status: DC
Start: 2022-08-18 — End: 2023-04-26
  Filled 2022-08-18: qty 90, 90d supply, fill #0
  Filled 2022-08-18: qty 30, 30d supply, fill #0
  Filled 2022-09-15 – 2022-09-27 (×2): qty 30, 30d supply, fill #1
  Filled 2022-11-01: qty 30, 30d supply, fill #2
  Filled 2022-12-12: qty 30, 30d supply, fill #3

## 2022-08-18 MED ORDER — AMLODIPINE BESYLATE 10 MG PO TABS
10.0000 mg | ORAL_TABLET | Freq: Every day | ORAL | 1 refills | Status: DC
Start: 2022-08-18 — End: 2023-04-26
  Filled 2022-08-18: qty 90, 90d supply, fill #0
  Filled 2022-08-18: qty 30, 30d supply, fill #0
  Filled 2022-09-15 – 2022-09-27 (×2): qty 30, 30d supply, fill #1
  Filled 2022-11-01: qty 30, 30d supply, fill #2
  Filled 2022-12-12: qty 30, 30d supply, fill #3

## 2022-08-18 MED ORDER — LOSARTAN POTASSIUM 50 MG PO TABS
50.0000 mg | ORAL_TABLET | Freq: Every day | ORAL | 1 refills | Status: DC
Start: 2022-08-18 — End: 2023-04-26
  Filled 2022-08-18 (×2): qty 90, 90d supply, fill #0
  Filled 2022-12-12: qty 90, 90d supply, fill #1

## 2022-08-18 NOTE — Telephone Encounter (Signed)
Requested medication (s) are due for refill today: Yes  Requested medication (s) are on the active medication list: Yes  Last refill:    Future visit scheduled: Yes - today  Notes to clinic:  Pt. Has appointment today.    Requested Prescriptions  Pending Prescriptions Disp Refills   amLODipine (NORVASC) 10 MG tablet 90 tablet 1    Sig: Take 1 tablet (10 mg total) by mouth daily.     Cardiovascular: Calcium Channel Blockers 2 Failed - 08/17/2022  5:35 PM      Failed - Valid encounter within last 6 months    Recent Outpatient Visits           10 months ago Medication refill   [place] [facility] Grayce Sessions, NP   1 year ago Prediabetes   [place] [facility] Grayce Sessions, NP   1 year ago Tobacco dependence   [place] [facility] Grayce Sessions, NP   2 years ago Gonorrhea   [place] [facility] Grayce Sessions, NP   2 years ago Discharge from penis   [place] [facility] Grayce Sessions, NP       Future Appointments             Today Grayce Sessions, NP [place] [facility]            Passed - Last BP in normal range    BP Readings from Last 1 Encounters:  10/06/21 132/77         Passed - Last Heart Rate in normal range    Pulse Readings from Last 1 Encounters:  10/06/21 (!) 57          hydrochlorothiazide (HYDRODIURIL) 25 MG tablet 90 tablet 1    Sig: TAKE 1 TABLET BY MOUTH ONCE DAILY IN THE MORNING     Cardiovascular: Diuretics - Thiazide Failed - 08/17/2022  5:35 PM      Failed - Cr in normal range and within 180 days    Creatinine, Ser  Date Value Ref Range Status  10/06/2021 0.85 0.76 - 1.27 mg/dL Final         Failed - K in normal range and within 180 days    Potassium  Date Value Ref Range Status  10/06/2021 4.1 3.5 - 5.2 mmol/L Final         Failed - Na in normal range and within 180 days     Sodium  Date Value Ref Range Status  10/06/2021 138 134 - 144 mmol/L Final         Failed - Valid encounter within last 6 months    Recent Outpatient Visits           10 months ago Medication refill   [place] [facility] Grayce Sessions, NP   1 year ago Prediabetes   [place] [facility] Grayce Sessions, NP   1 year ago Tobacco dependence   [place] [facility] Grayce Sessions, NP   2 years ago Gonorrhea   [place] [facility] Grayce Sessions, NP   2 years ago Discharge from penis   [place] [facility] Grayce Sessions, NP       Future Appointments             Today Grayce Sessions, NP  [facility]  Passed - Last BP in normal range    BP Readings from Last 1 Encounters:  10/06/21 132/77          losartan (COZAAR) 50 MG tablet 90 tablet 1    Sig: TAKE 1 TABLET (50 MG TOTAL) BY MOUTH DAILY.     Cardiovascular:  Angiotensin Receptor Blockers Failed - 08/17/2022  5:35 PM      Failed - Cr in normal range and within 180 days    Creatinine, Ser  Date Value Ref Range Status  10/06/2021 0.85 0.76 - 1.27 mg/dL Final         Failed - K in normal range and within 180 days    Potassium  Date Value Ref Range Status  10/06/2021 4.1 3.5 - 5.2 mmol/L Final         Failed - Valid encounter within last 6 months    Recent Outpatient Visits           10 months ago Medication refill   [place] [facility] Grayce Sessions, NP   1 year ago Prediabetes   [place] [facility] Grayce Sessions, NP   1 year ago Tobacco dependence   [place] [facility] Grayce Sessions, NP   2 years ago Gonorrhea   [place] [facility] Grayce Sessions, NP   2 years ago Discharge from penis   [place] [facility] Grayce Sessions,  NP       Future Appointments             Today Grayce Sessions, NP [place] [facility]            Passed - Patient is not pregnant      Passed - Last BP in normal range    BP Readings from Last 1 Encounters:  10/06/21 132/77

## 2022-08-18 NOTE — Patient Instructions (Signed)
Calorie Counting for Weight Loss Calories are units of energy. Your body needs a certain number of calories from food to keep going throughout the day. When you eat or drink more calories than your body needs, your body stores the extra calories mostly as fat. When you eat or drink fewer calories than your body needs, your body burns fat to get the energy it needs. Calorie counting means keeping track of how many calories you eat and drink each day. Calorie counting can be helpful if you need to lose weight. If you eat fewer calories than your body needs, you should lose weight. Ask your health care provider what a healthy weight is for you. For calorie counting to work, you will need to eat the right number of calories each day to lose a healthy amount of weight per week. A dietitian can help you figure out how many calories you need in a day and will suggest ways to reach your calorie goal. A healthy amount of weight to lose each week is usually 1-2 lb (0.5-0.9 kg). This usually means that your daily calorie intake should be reduced by 500-750 calories. Eating 1,200-1,500 calories a day can help most women lose weight. Eating 1,500-1,800 calories a day can help most men lose weight. What do I need to know about calorie counting? Work with your health care provider or dietitian to determine how many calories you should get each day. To meet your daily calorie goal, you will need to: Find out how many calories are in each food that you would like to eat. Try to do this before you eat. Decide how much of the food you plan to eat. Keep a food log. Do this by writing down what you ate and how many calories it had. To successfully lose weight, it is important to balance calorie counting with a healthy lifestyle that includes regular activity. Where do I find calorie information?  The number of calories in a food can be found on a Nutrition Facts label. If a food does not have a Nutrition Facts label, try  to look up the calories online or ask your dietitian for help. Remember that calories are listed per serving. If you choose to have more than one serving of a food, you will have to multiply the calories per serving by the number of servings you plan to eat. For example, the label on a package of bread might say that a serving size is 1 slice and that there are 90 calories in a serving. If you eat 1 slice, you will have eaten 90 calories. If you eat 2 slices, you will have eaten 180 calories. How do I keep a food log? After each time that you eat, record the following in your food log as soon as possible: What you ate. Be sure to include toppings, sauces, and other extras on the food. How much you ate. This can be measured in cups, ounces, or number of items. How many calories were in each food and drink. The total number of calories in the food you ate. Keep your food log near you, such as in a pocket-sized notebook or on an app or website on your mobile phone. Some programs will calculate calories for you and show you how many calories you have left to meet your daily goal. What are some portion-control tips? Know how many calories are in a serving. This will help you know how many servings you can have of a certain   food. Use a measuring cup to measure serving sizes. You could also try weighing out portions on a kitchen scale. With time, you will be able to estimate serving sizes for some foods. Take time to put servings of different foods on your favorite plates or in your favorite bowls and cups so you know what a serving looks like. Try not to eat straight from a food's packaging, such as from a bag or box. Eating straight from the package makes it hard to see how much you are eating and can lead to overeating. Put the amount you would like to eat in a cup or on a plate to make sure you are eating the right portion. Use smaller plates, glasses, and bowls for smaller portions and to prevent  overeating. Try not to multitask. For example, avoid watching TV or using your computer while eating. If it is time to eat, sit down at a table and enjoy your food. This will help you recognize when you are full. It will also help you be more mindful of what and how much you are eating. What are tips for following this plan? Reading food labels Check the calorie count compared with the serving size. The serving size may be smaller than what you are used to eating. Check the source of the calories. Try to choose foods that are high in protein, fiber, and vitamins, and low in saturated fat, trans fat, and sodium. Shopping Read nutrition labels while you shop. This will help you make healthy decisions about which foods to buy. Pay attention to nutrition labels for low-fat or fat-free foods. These foods sometimes have the same number of calories or more calories than the full-fat versions. They also often have added sugar, starch, or salt to make up for flavor that was removed with the fat. Make a grocery list of lower-calorie foods and stick to it. Cooking Try to cook your favorite foods in a healthier way. For example, try baking instead of frying. Use low-fat dairy products. Meal planning Use more fruits and vegetables. One-half of your plate should be fruits and vegetables. Include lean proteins, such as chicken, turkey, and fish. Lifestyle Each week, aim to do one of the following: 150 minutes of moderate exercise, such as walking. 75 minutes of vigorous exercise, such as running. General information Know how many calories are in the foods you eat most often. This will help you calculate calorie counts faster. Find a way of tracking calories that works for you. Get creative. Try different apps or programs if writing down calories does not work for you. What foods should I eat?  Eat nutritious foods. It is better to have a nutritious, high-calorie food, such as an avocado, than a food with  few nutrients, such as a bag of potato chips. Use your calories on foods and drinks that will fill you up and will not leave you hungry soon after eating. Examples of foods that fill you up are nuts and nut butters, vegetables, lean proteins, and high-fiber foods such as whole grains. High-fiber foods are foods with more than 5 g of fiber per serving. Pay attention to calories in drinks. Low-calorie drinks include water and unsweetened drinks. The items listed above may not be a complete list of foods and beverages you can eat. Contact a dietitian for more information. What foods should I limit? Limit foods or drinks that are not good sources of vitamins, minerals, or protein or that are high in unhealthy fats. These   include: Candy. Other sweets. Sodas, specialty coffee drinks, alcohol, and juice. The items listed above may not be a complete list of foods and beverages you should avoid. Contact a dietitian for more information. How do I count calories when eating out? Pay attention to portions. Often, portions are much larger when eating out. Try these tips to keep portions smaller: Consider sharing a meal instead of getting your own. If you get your own meal, eat only half of it. Before you start eating, ask for a container and put half of your meal into it. When available, consider ordering smaller portions from the menu instead of full portions. Pay attention to your food and drink choices. Knowing the way food is cooked and what is included with the meal can help you eat fewer calories. If calories are listed on the menu, choose the lower-calorie options. Choose dishes that include vegetables, fruits, whole grains, low-fat dairy products, and lean proteins. Choose items that are boiled, broiled, grilled, or steamed. Avoid items that are buttered, battered, fried, or served with cream sauce. Items labeled as crispy are usually fried, unless stated otherwise. Choose water, low-fat milk,  unsweetened iced tea, or other drinks without added sugar. If you want an alcoholic beverage, choose a lower-calorie option, such as a glass of wine or light beer. Ask for dressings, sauces, and syrups on the side. These are usually high in calories, so you should limit the amount you eat. If you want a salad, choose a garden salad and ask for grilled meats. Avoid extra toppings such as bacon, cheese, or fried items. Ask for the dressing on the side, or ask for olive oil and vinegar or lemon to use as dressing. Estimate how many servings of a food you are given. Knowing serving sizes will help you be aware of how much food you are eating at restaurants. Where to find more information Centers for Disease Control and Prevention: www.cdc.gov U.S. Department of Agriculture: myplate.gov Summary Calorie counting means keeping track of how many calories you eat and drink each day. If you eat fewer calories than your body needs, you should lose weight. A healthy amount of weight to lose per week is usually 1-2 lb (0.5-0.9 kg). This usually means reducing your daily calorie intake by 500-750 calories. The number of calories in a food can be found on a Nutrition Facts label. If a food does not have a Nutrition Facts label, try to look up the calories online or ask your dietitian for help. Use smaller plates, glasses, and bowls for smaller portions and to prevent overeating. Use your calories on foods and drinks that will fill you up and not leave you hungry shortly after a meal. This information is not intended to replace advice given to you by your health care provider. Make sure you discuss any questions you have with your health care provider. Document Revised: 03/28/2019 Document Reviewed: 03/28/2019 Elsevier Patient Education  2023 [employer]  

## 2022-08-19 LAB — CBC WITH DIFFERENTIAL/PLATELET
Basophils Absolute: 0 10*3/uL (ref 0.0–0.2)
Basos: 1 %
EOS (ABSOLUTE): 0.1 10*3/uL (ref 0.0–0.4)
Eos: 1 %
Hematocrit: 43.1 % (ref 37.5–51.0)
Hemoglobin: 14.6 g/dL (ref 13.0–17.7)
Immature Grans (Abs): 0 10*3/uL (ref 0.0–0.1)
Immature Granulocytes: 1 %
Lymphocytes Absolute: 1.7 10*3/uL (ref 0.7–3.1)
Lymphs: 27 %
MCH: 30.9 pg (ref 26.6–33.0)
MCHC: 33.9 g/dL (ref 31.5–35.7)
MCV: 91 fL (ref 79–97)
Monocytes Absolute: 0.5 10*3/uL (ref 0.1–0.9)
Monocytes: 8 %
Neutrophils Absolute: 4 10*3/uL (ref 1.4–7.0)
Neutrophils: 62 %
Platelets: 218 10*3/uL (ref 150–450)
RBC: 4.73 x10E6/uL (ref 4.14–5.80)
RDW: 13.9 % (ref 11.6–15.4)
WBC: 6.4 10*3/uL (ref 3.4–10.8)

## 2022-08-19 LAB — CMP14+EGFR
ALT: 30 IU/L (ref 0–44)
AST: 35 IU/L (ref 0–40)
Albumin: 4.6 g/dL (ref 4.1–5.1)
Alkaline Phosphatase: 62 IU/L (ref 44–121)
BUN/Creatinine Ratio: 14 (ref 9–20)
BUN: 12 mg/dL (ref 6–24)
Bilirubin Total: 0.4 mg/dL (ref 0.0–1.2)
CO2: 24 mmol/L (ref 20–29)
Calcium: 9.5 mg/dL (ref 8.7–10.2)
Chloride: 103 mmol/L (ref 96–106)
Creatinine, Ser: 0.88 mg/dL (ref 0.76–1.27)
Globulin, Total: 2.3 g/dL (ref 1.5–4.5)
Glucose: 99 mg/dL (ref 70–99)
Potassium: 4.5 mmol/L (ref 3.5–5.2)
Sodium: 141 mmol/L (ref 134–144)
Total Protein: 6.9 g/dL (ref 6.0–8.5)
eGFR: 109 mL/min/{1.73_m2} (ref >59)

## 2022-08-19 LAB — HCV INTERPRETATION

## 2022-08-19 LAB — LIPID PANEL
Chol/HDL Ratio: 4.3 ratio (ref 0.0–5.0)
Cholesterol, Total: 221 mg/dL — ABNORMAL HIGH (ref 100–199)
HDL: 51 mg/dL (ref >39)
LDL Chol Calc (NIH): 144 mg/dL — ABNORMAL HIGH (ref 0–99)
Triglycerides: 145 mg/dL (ref 0–149)
VLDL Cholesterol Cal: 26 mg/dL (ref 5–40)

## 2022-08-19 LAB — HCV AB W REFLEX TO QUANT PCR: HCV Ab: NONREACTIVE

## 2022-08-21 NOTE — Progress Notes (Signed)
[facility]  Thomas Mclean, is a 42 y.o. male  [id number]  [id number]  DOB - 1980-12-30  Chief Complaint  Patient presents with   Hypertension    Been out of medication for 2 days        Subjective:   Thomas Mclean is a 42 y.o. overweight male here today for a follow up visit for hypertension. Patient has No headache, No chest pain, No abdominal pain - No Nausea, No new weakness tingling or numbness, No Cough - shortness of breath.  Requesting medication refills  No problems updated.  No Known Allergies  Past Medical History:  Diagnosis Date   Hypercholesteremia    Hypertension 2014   Objective:   Vitals:   08/18/22 0828 08/18/22 0831  BP: (Abnormal) 143/85 (Abnormal) 148/86  Pulse: 61   Resp: 16   SpO2: 99%   Weight: 200 lb (90.7 kg)   Height: 5\' 11"  (1.803 m)     Comprehensive ROS Pertinent positive and negative noted in HPI   Exam General appearance : Awake, alert, not in any distress. Speech Clear. Not toxic looking HEENT: Atraumatic and Normocephalic, pupils equally reactive to light and accomodation Neck: Supple, no JVD. No cervical lymphadenopathy.  Chest: Good air entry bilaterally, no added sounds  CVS: S1 S2 regular, no murmurs.  Abdomen: Bowel sounds present, Non tender and not distended with no gaurding, rigidity or rebound. Extremities: B/L Lower Ext shows no edema, both legs are warm to touch Neurology: Awake alert, and oriented X 3, CN II-XII intact, Non focal Skin: No Rash  Data Review Lab Results  Component Value Date   HGBA1C 5.9 (A) 04/08/2021   HGBA1C 5.7 (A) 01/14/2020   HGBA1C 5.8 (A) 04/10/2019    Assessment & Plan  Thomas Mclean was seen today for hypertension.  Diagnoses and all orders for this visit:  Medication refill -     amLODipine (NORVASC) 10 MG tablet; Take 1 tablet (10 mg total) by mouth daily. -     hydrochlorothiazide (HYDRODIURIL) 25 MG tablet; Take 1 tablet (25 mg total) by mouth every  morning. -     losartan (COZAAR) 50 MG tablet; Take 1 tablet (50 mg total) by mouth daily.  Overweight (BMI 25.0-29.9) Discussed diet and exercise for person with BMI >25. Instructed: You must burn more calories than you eat. Losing 5 percent of your body weight should be considered a success. In the longer term, losing more than 15 percent of your body weight and staying at this weight is an extremely good result. However, keep in mind that even losing 5 percent of your body weight leads to important health benefits, so try not to get discouraged if you're not able to lose more than this. Will recheck weight in 3-6 months.   Hypertension, unspecified type BP goal - < 130/80 Explained that having normal blood pressure is the goal and medications are helping to get to goal and maintain normal blood pressure. DIET: Limit salt intake, read nutrition labels to check salt content, limit fried and high fatty foods  Avoid using multisymptom OTC cold preparations that generally contain sudafed which can rise BP. Consult with pharmacist on best cold relief products to use for persons with HTN EXERCISE Discussed incorporating exercise such as walking - 30 minutes most days of the week and can do in 10 minute intervals    -     CMP14+EGFR -     CBC with Differential -     Lipid Panel -  amLODipine (NORVASC) 10 MG tablet; Take 1 tablet (10 mg total) by mouth daily. -     hydrochlorothiazide (HYDRODIURIL) 25 MG tablet; Take 1 tablet (25 mg total) by mouth every morning. -     losartan (COZAAR) 50 MG tablet; Take 1 tablet (50 mg total) by mouth daily.  Family history of MI (myocardial infarction) -     Lipid Panel  Encounter for HCV screening test for low risk patient -     HCV Ab w Reflex to Quant PCR  Other orders -     Interpretation:     Patient have been counseled extensively about nutrition and exercise. Other issues discussed during this visit include: low cholesterol diet, weight  control and daily exercise, foot care, annual eye examinations at Ophthalmology, importance of adherence with medications and regular follow-up. We also discussed long term complications of uncontrolled diabetes and hypertension.   Return in about 3 months (around 11/18/2022) for Bp .  The patient was given clear instructions to go to ER or return to medical center if symptoms don't improve, worsen or new problems develop. The patient verbalized understanding. The patient was told to call to get lab results if they haven't heard anything in the next week.   This note has been created with Education officer, environmental. Any transcriptional errors are unintentional.   Grayce Sessions, NP 08/21/2022, 10:53 PM

## 2022-08-30 ENCOUNTER — Other Ambulatory Visit (INDEPENDENT_AMBULATORY_CARE_PROVIDER_SITE_OTHER): Payer: Self-pay | Admitting: Primary Care

## 2022-08-30 ENCOUNTER — Other Ambulatory Visit: Payer: Self-pay

## 2022-08-30 MED ORDER — ATORVASTATIN CALCIUM 40 MG PO TABS
40.0000 mg | ORAL_TABLET | Freq: Every day | ORAL | 3 refills | Status: DC
  Filled 2022-08-30: qty 90, 90d supply, fill #0
  Filled 2022-09-08: qty 30, 30d supply, fill #0
  Filled 2022-11-01: qty 30, 30d supply, fill #1
  Filled 2022-12-12: qty 30, 30d supply, fill #2
  Filled 2023-06-12: qty 30, 30d supply, fill #3
  Filled 2023-07-17: qty 90, 90d supply, fill #4

## 2022-09-06 ENCOUNTER — Encounter (INDEPENDENT_AMBULATORY_CARE_PROVIDER_SITE_OTHER): Payer: Self-pay

## 2022-09-06 ENCOUNTER — Other Ambulatory Visit: Payer: Self-pay

## 2022-09-06 ENCOUNTER — Ambulatory Visit (INDEPENDENT_AMBULATORY_CARE_PROVIDER_SITE_OTHER): Payer: 59

## 2022-09-08 ENCOUNTER — Other Ambulatory Visit: Payer: Self-pay

## 2022-09-08 ENCOUNTER — Ambulatory Visit (INDEPENDENT_AMBULATORY_CARE_PROVIDER_SITE_OTHER): Payer: 59

## 2022-09-09 ENCOUNTER — Ambulatory Visit (INDEPENDENT_AMBULATORY_CARE_PROVIDER_SITE_OTHER): Payer: 59

## 2022-09-15 ENCOUNTER — Other Ambulatory Visit: Payer: Self-pay

## 2022-09-19 ENCOUNTER — Ambulatory Visit (INDEPENDENT_AMBULATORY_CARE_PROVIDER_SITE_OTHER): Payer: 59

## 2022-09-21 ENCOUNTER — Other Ambulatory Visit: Payer: Self-pay

## 2022-09-27 ENCOUNTER — Other Ambulatory Visit: Payer: Self-pay

## 2022-11-01 ENCOUNTER — Other Ambulatory Visit: Payer: Self-pay

## 2022-11-03 ENCOUNTER — Other Ambulatory Visit: Payer: Self-pay

## 2022-12-12 ENCOUNTER — Other Ambulatory Visit: Payer: Self-pay

## 2022-12-21 ENCOUNTER — Other Ambulatory Visit: Payer: Self-pay

## 2023-01-03 ENCOUNTER — Other Ambulatory Visit: Payer: Self-pay

## 2023-01-05 ENCOUNTER — Other Ambulatory Visit: Payer: Self-pay

## 2023-04-13 ENCOUNTER — Ambulatory Visit (INDEPENDENT_AMBULATORY_CARE_PROVIDER_SITE_OTHER): Payer: 59 | Admitting: Primary Care

## 2023-04-13 ENCOUNTER — Telehealth (INDEPENDENT_AMBULATORY_CARE_PROVIDER_SITE_OTHER): Payer: Self-pay | Admitting: Primary Care

## 2023-04-13 NOTE — Telephone Encounter (Signed)
Called to reschedule atp with pt. Pt has a history of No Shows and pt was a no show on 2/13. Pt was rescheduled for 2/20.

## 2023-04-20 ENCOUNTER — Ambulatory Visit (INDEPENDENT_AMBULATORY_CARE_PROVIDER_SITE_OTHER): Payer: 59 | Admitting: Primary Care

## 2023-04-26 ENCOUNTER — Encounter (INDEPENDENT_AMBULATORY_CARE_PROVIDER_SITE_OTHER): Payer: Self-pay | Admitting: Primary Care

## 2023-04-26 ENCOUNTER — Ambulatory Visit (INDEPENDENT_AMBULATORY_CARE_PROVIDER_SITE_OTHER): Payer: 59 | Admitting: Primary Care

## 2023-04-26 ENCOUNTER — Other Ambulatory Visit: Payer: Self-pay

## 2023-04-26 VITALS — BP 123/72 | HR 67 | Wt 214.8 lb

## 2023-04-26 DIAGNOSIS — Z760 Encounter for issue of repeat prescription: Secondary | ICD-10-CM | POA: Diagnosis not present

## 2023-04-26 DIAGNOSIS — E782 Mixed hyperlipidemia: Secondary | ICD-10-CM | POA: Diagnosis not present

## 2023-04-26 DIAGNOSIS — I10 Essential (primary) hypertension: Secondary | ICD-10-CM

## 2023-04-26 MED ORDER — HYDROCHLOROTHIAZIDE 25 MG PO TABS
25.0000 mg | ORAL_TABLET | Freq: Every morning | ORAL | 1 refills | Status: DC
Start: 2023-04-26 — End: 2023-08-22
  Filled 2023-04-26: qty 30, 30d supply, fill #0
  Filled 2023-06-12: qty 30, 30d supply, fill #1
  Filled 2023-07-17: qty 90, 90d supply, fill #2

## 2023-04-26 MED ORDER — AMLODIPINE BESYLATE 10 MG PO TABS
10.0000 mg | ORAL_TABLET | Freq: Every day | ORAL | 1 refills | Status: DC
  Filled 2023-04-26: qty 30, 30d supply, fill #0
  Filled 2023-06-12: qty 30, 30d supply, fill #1
  Filled 2023-07-17: qty 90, 90d supply, fill #2

## 2023-04-26 MED ORDER — LOSARTAN POTASSIUM 50 MG PO TABS
50.0000 mg | ORAL_TABLET | Freq: Every day | ORAL | 1 refills | Status: DC
Start: 2023-04-26 — End: 2023-08-22
  Filled 2023-04-26: qty 30, 30d supply, fill #0
  Filled 2023-06-12: qty 30, 30d supply, fill #1
  Filled 2023-07-17: qty 90, 90d supply, fill #2

## 2023-04-26 NOTE — Progress Notes (Signed)
 214.8 lbs

## 2023-04-28 ENCOUNTER — Other Ambulatory Visit: Payer: Self-pay

## 2023-04-30 NOTE — Progress Notes (Signed)
 [facility]  Thomas Mclean, is a 43 y.o. male  [id number]  [id number]  DOB - 1980-12-30  Chief Complaint  Patient presents with   Medication Refill       Subjective:   Thomas Mclean is a 43 y.o. male here today for an acute visit.  Requestion medications refilled   No problems updated.  Comprehensive ROS Pertinent positive and negative noted in HPI   No Known Allergies  Past Medical History:  Diagnosis Date   Hypercholesteremia    Hypertension 2014    Current Outpatient Medications on File Prior to Visit  Medication Sig Dispense Refill   atorvastatin (LIPITOR) 40 MG tablet Take 1 tablet (40 mg total) by mouth daily. 90 tablet 3   No current facility-administered medications on file prior to visit.   Health Maintenance  Topic Date Due   Pneumococcal Vaccination (1 of 2 - PCV) Never done   DTaP/Tdap/Td vaccine (1 - Tdap) Never done   Flu Shot  09/29/2022   COVID-19 Vaccine (1 - 2024-25 season) Never done   Hepatitis C Screening  Completed   HIV Screening  Completed   HPV Vaccine  Aged Out    Objective:  BP 123/72 (BP Location: Left Arm, Patient Position: Sitting, Cuff Size: Normal)   Pulse 67   Wt 214 lb 12.8 oz (97.4 kg)   SpO2 99%   BMI 29.96 kg/m   General: No apparent distress. Eyes: Extraocular eye movements intact, pupils equal and round. Neck: Supple, trachea midline. Thyroid: No enlargement, mobile without fixation, no tenderness. Cardiovascular: Regular rhythm and rate, no murmur, normal radial pulses. Respiratory: Normal respiratory effort, clear to auscultation. Gastrointestinal: Normal pitch active bowel sounds, nontender abdomen without distention or appreciable hepatomegaly. Musculoskeletal: Normal muscle tone, no tenderness on palpation of tibia, no excessive thoracic kyphosis. Skin: Appropriate warmth, no visible rash. Mental status: Alert, conversant, speech clear, thought logical, appropriate mood and affect, no  hallucinations or delusions evident. Hematologic/lymphatic: No cervical adenopathy, no visible ecchymoses.   Assessment & Plan   Mixed hyperlipidemia -     Lipid panel; Future  Medication refill -     Losartan Potassium; Take 1 tablet (50 mg total) by mouth daily.  Dispense: 90 tablet; Refill: 1 -     amLODIPine Besylate; Take 1 tablet (10 mg total) by mouth daily.  Dispense: 90 tablet; Refill: 1 -     hydroCHLOROthiazide; Take 1 tablet (25 mg total) by mouth every morning.  Dispense: 90 tablet; Refill: 1  Hypertension, unspecified type BP goal - < 130/80 Explained that having normal blood pressure is the goal and medications are helping to get to goal and maintain normal blood pressure. DIET: Limit salt intake, read nutrition labels to check salt content, limit fried and high fatty foods  Avoid using multisymptom OTC cold preparations that generally contain sudafed which can rise BP. Consult with pharmacist on best cold relief products to use for persons with HTN EXERCISE Discussed incorporating exercise such as walking - 30 minutes most days of the week and can do in 10 minute intervals    -     Losartan Potassium; Take 1 tablet (50 mg total) by mouth daily.  Dispense: 90 tablet; Refill: 1 -     amLODIPine Besylate; Take 1 tablet (10 mg total) by mouth daily.  Dispense: 90 tablet; Refill: 1 -     hydroCHLOROthiazide; Take 1 tablet (25 mg total) by mouth every morning.  Dispense: 90 tablet; Refill: 1 -  CMP14+EGFR; Future -     CBC with Differential/Platelet     Patient have been counseled extensively about nutrition and exercise. Other issues discussed during this visit include: low cholesterol diet, weight control and daily exercise, foot care, annual eye examinations at Ophthalmology, importance of adherence with medications and regular follow-up. We also discussed long term complications of uncontrolled diabetes and hypertension.   No follow-ups on file.  The patient was given  clear instructions to go to ER or return to medical center if symptoms don't improve, worsen or new problems develop. The patient verbalized understanding. The patient was told to call to get lab results if they haven't heard anything in the next week.   This note has been created with Education officer, environmental. Any transcriptional errors are unintentional.   Grayce Sessions, NP 04/30/2023, 9:00 PM

## 2023-05-01 ENCOUNTER — Ambulatory Visit (INDEPENDENT_AMBULATORY_CARE_PROVIDER_SITE_OTHER): Payer: 59

## 2023-05-02 ENCOUNTER — Ambulatory Visit (INDEPENDENT_AMBULATORY_CARE_PROVIDER_SITE_OTHER)

## 2023-05-04 ENCOUNTER — Ambulatory Visit (INDEPENDENT_AMBULATORY_CARE_PROVIDER_SITE_OTHER)

## 2023-05-08 ENCOUNTER — Ambulatory Visit (INDEPENDENT_AMBULATORY_CARE_PROVIDER_SITE_OTHER): Admitting: Primary Care

## 2023-05-08 DIAGNOSIS — Z114 Encounter for screening for human immunodeficiency virus [HIV]: Secondary | ICD-10-CM

## 2023-05-08 DIAGNOSIS — E782 Mixed hyperlipidemia: Secondary | ICD-10-CM

## 2023-05-08 DIAGNOSIS — Z1159 Encounter for screening for other viral diseases: Secondary | ICD-10-CM | POA: Diagnosis not present

## 2023-05-08 DIAGNOSIS — I10 Essential (primary) hypertension: Secondary | ICD-10-CM | POA: Diagnosis not present

## 2023-05-08 NOTE — Progress Notes (Signed)
 Pt came into the office today for lab work

## 2023-05-08 NOTE — Addendum Note (Signed)
 Addended by: Herbert Deaner on: 05/08/2023 09:03 AM   Modules accepted: Orders

## 2023-05-09 LAB — CMP14+EGFR
ALT: 19 IU/L (ref 0–44)
AST: 19 IU/L (ref 0–40)
Albumin: 4.6 g/dL (ref 4.1–5.1)
Alkaline Phosphatase: 72 IU/L (ref 44–121)
BUN/Creatinine Ratio: 14 (ref 9–20)
BUN: 13 mg/dL (ref 6–24)
Bilirubin Total: 0.2 mg/dL (ref 0.0–1.2)
CO2: 23 mmol/L (ref 20–29)
Calcium: 9.6 mg/dL (ref 8.7–10.2)
Chloride: 100 mmol/L (ref 96–106)
Creatinine, Ser: 0.93 mg/dL (ref 0.76–1.27)
Globulin, Total: 2.3 g/dL (ref 1.5–4.5)
Glucose: 93 mg/dL (ref 70–99)
Potassium: 4.2 mmol/L (ref 3.5–5.2)
Sodium: 138 mmol/L (ref 134–144)
Total Protein: 6.9 g/dL (ref 6.0–8.5)
eGFR: 104 mL/min/{1.73_m2} (ref >59)

## 2023-05-09 LAB — LIPID PANEL
Chol/HDL Ratio: 3.3 ratio (ref 0.0–5.0)
Cholesterol, Total: 203 mg/dL — ABNORMAL HIGH (ref 100–199)
HDL: 61 mg/dL (ref >39)
LDL Chol Calc (NIH): 119 mg/dL — ABNORMAL HIGH (ref 0–99)
Triglycerides: 130 mg/dL (ref 0–149)
VLDL Cholesterol Cal: 23 mg/dL (ref 5–40)

## 2023-05-09 LAB — HCV INTERPRETATION

## 2023-05-09 LAB — HIV ANTIBODY (ROUTINE TESTING W REFLEX): HIV Screen 4th Generation wRfx: NONREACTIVE

## 2023-05-09 LAB — HCV AB W REFLEX TO QUANT PCR: HCV Ab: NONREACTIVE

## 2023-05-14 ENCOUNTER — Encounter (INDEPENDENT_AMBULATORY_CARE_PROVIDER_SITE_OTHER): Payer: Self-pay | Admitting: Primary Care

## 2023-06-12 ENCOUNTER — Other Ambulatory Visit: Payer: Self-pay

## 2023-06-15 ENCOUNTER — Other Ambulatory Visit: Payer: Self-pay

## 2023-07-17 ENCOUNTER — Other Ambulatory Visit: Payer: Self-pay

## 2023-07-21 ENCOUNTER — Other Ambulatory Visit: Payer: Self-pay

## 2023-07-25 ENCOUNTER — Ambulatory Visit (INDEPENDENT_AMBULATORY_CARE_PROVIDER_SITE_OTHER): Payer: 59 | Admitting: Primary Care

## 2023-08-11 ENCOUNTER — Ambulatory Visit (INDEPENDENT_AMBULATORY_CARE_PROVIDER_SITE_OTHER): Admitting: Primary Care

## 2023-08-11 ENCOUNTER — Encounter (INDEPENDENT_AMBULATORY_CARE_PROVIDER_SITE_OTHER): Payer: Self-pay

## 2023-08-22 ENCOUNTER — Ambulatory Visit (INDEPENDENT_AMBULATORY_CARE_PROVIDER_SITE_OTHER): Payer: Self-pay | Admitting: Primary Care

## 2023-08-22 ENCOUNTER — Other Ambulatory Visit: Payer: Self-pay

## 2023-08-22 ENCOUNTER — Encounter (INDEPENDENT_AMBULATORY_CARE_PROVIDER_SITE_OTHER): Payer: Self-pay | Admitting: Primary Care

## 2023-08-22 DIAGNOSIS — I10 Essential (primary) hypertension: Secondary | ICD-10-CM

## 2023-08-22 DIAGNOSIS — Z760 Encounter for issue of repeat prescription: Secondary | ICD-10-CM

## 2023-08-22 DIAGNOSIS — E782 Mixed hyperlipidemia: Secondary | ICD-10-CM

## 2023-08-22 MED ORDER — HYDROCHLOROTHIAZIDE 25 MG PO TABS
25.0000 mg | ORAL_TABLET | Freq: Every morning | ORAL | 1 refills | Status: AC
  Filled 2023-08-22 – 2023-11-09 (×2): qty 90, 90d supply, fill #0
  Filled 2024-04-02: qty 90, 90d supply, fill #1

## 2023-08-22 MED ORDER — AMLODIPINE BESYLATE 10 MG PO TABS
10.0000 mg | ORAL_TABLET | Freq: Every day | ORAL | 1 refills | Status: AC
  Filled 2023-08-22 – 2023-11-09 (×2): qty 90, 90d supply, fill #0
  Filled 2024-04-02: qty 90, 90d supply, fill #1

## 2023-08-22 MED ORDER — LOSARTAN POTASSIUM 50 MG PO TABS
50.0000 mg | ORAL_TABLET | Freq: Every day | ORAL | 1 refills | Status: AC
  Filled 2023-08-22 – 2023-11-09 (×2): qty 90, 90d supply, fill #0
  Filled 2024-04-02: qty 90, 90d supply, fill #1

## 2023-08-22 NOTE — Progress Notes (Signed)
 [facility]  Thomas Mclean, is a 43 y.o. male  [id number]  [id number]  DOB - 1980-05-20  Chief Complaint  Patient presents with   Hypertension   Hyperlipidemia       Subjective:   Mr. Thomas Mclean is a 43 y.o. male here today for a follow up visit. Patient has No headache, No chest pain, No abdominal pain - No Nausea, No new weakness tingling or numbness, No Cough - shortness of breath HPI  No problems updated.  Comprehensive ROS Pertinent positive and negative noted in HPI   No Known Allergies  Past Medical History:  Diagnosis Date   Hypercholesteremia    Hypertension 2014    Current Outpatient Medications on File Prior to Visit  Medication Sig Dispense Refill   amLODipine  (NORVASC ) 10 MG tablet Take 1 tablet (10 mg total) by mouth daily. 90 tablet 1   atorvastatin  (LIPITOR) 40 MG tablet Take 1 tablet (40 mg total) by mouth daily. 90 tablet 3   hydrochlorothiazide  (HYDRODIURIL ) 25 MG tablet Take 1 tablet (25 mg total) by mouth every morning. 90 tablet 1   losartan  (COZAAR ) 50 MG tablet Take 1 tablet (50 mg total) by mouth daily. 90 tablet 1   No current facility-administered medications on file prior to visit.   Health Maintenance  Topic Date Due   DTaP/Tdap/Td vaccine (1 - Tdap) Never done   Pneumococcal Vaccination (1 of 2 - PCV) Never done   Hepatitis B Vaccine (1 of 3 - 19+ 3-dose series) Never done   HPV Vaccine (1 - 3-dose SCDM series) Never done   COVID-19 Vaccine (1 - 2024-25 season) Never done   Flu Shot  09/29/2023   Hepatitis C Screening  Completed   HIV Screening  Completed   Meningitis B Vaccine  Aged Out    Objective:     Physical Exam Vitals reviewed.  Constitutional:      Appearance: He is obese.  HENT:     Head: Normocephalic.     Right Ear: Tympanic membrane and external ear normal.     Left Ear: Tympanic membrane and external ear normal.     Nose: Nose normal.   Eyes:     Extraocular Movements:  Extraocular movements intact.     Pupils: Pupils are equal, round, and reactive to light.    Cardiovascular:     Rate and Rhythm: Normal rate and regular rhythm.  Pulmonary:     Effort: Pulmonary effort is normal.     Breath sounds: Normal breath sounds.  Abdominal:     General: Bowel sounds are normal. There is distension.     Palpations: Abdomen is soft.   Musculoskeletal:        General: Normal range of motion.   Skin:    General: Skin is warm and dry.   Neurological:     Mental Status: He is oriented to person, place, and time.   Psychiatric:        Mood and Affect: Mood normal.        Behavior: Behavior normal.        Thought Content: Thought content normal.        Judgment: Judgment normal.       Assessment & Plan  There are no diagnoses linked to this encounter.   Patient have been counseled extensively about nutrition and exercise. Other issues discussed during this visit include: low cholesterol diet, weight control and daily exercise, foot care, annual eye examinations at Ophthalmology, importance of  adherence with medications and regular follow-up. We also discussed long term complications of uncontrolled diabetes and hypertension.   No follow-ups on file.  The patient was given clear instructions to go to ER or return to medical center if symptoms don't improve, worsen or new problems develop. The patient verbalized understanding. The patient was told to call to get lab results if they haven't heard anything in the next week.   This note has been created with Education officer, environmental. Any transcriptional errors are unintentional.   Rosaline SHAUNNA Bohr, NP 08/22/2023, 4:26 PM

## 2023-09-12 ENCOUNTER — Other Ambulatory Visit (INDEPENDENT_AMBULATORY_CARE_PROVIDER_SITE_OTHER)

## 2023-11-09 ENCOUNTER — Other Ambulatory Visit: Payer: Self-pay

## 2023-11-09 ENCOUNTER — Other Ambulatory Visit (INDEPENDENT_AMBULATORY_CARE_PROVIDER_SITE_OTHER): Payer: Self-pay | Admitting: Primary Care

## 2023-11-10 ENCOUNTER — Other Ambulatory Visit: Payer: Self-pay

## 2023-11-10 MED ORDER — ATORVASTATIN CALCIUM 40 MG PO TABS
40.0000 mg | ORAL_TABLET | Freq: Every day | ORAL | 1 refills | Status: AC
  Filled 2023-11-10 – 2023-12-05 (×2): qty 90, 90d supply, fill #0
  Filled 2024-04-02: qty 90, 90d supply, fill #1

## 2023-11-10 NOTE — Telephone Encounter (Signed)
 Requested Prescriptions  Pending Prescriptions Disp Refills   atorvastatin  (LIPITOR) 40 MG tablet 90 tablet 1    Sig: Take 1 tablet (40 mg total) by mouth daily.     Cardiovascular:  Antilipid - Statins Failed - 11/10/2023 11:32 AM      Failed - Lipid Panel in normal range within the last 12 months    Cholesterol, Total  Date Value Ref Range Status  05/08/2023 203 (H) 100 - 199 mg/dL Final   LDL Chol Calc (NIH)  Date Value Ref Range Status  05/08/2023 119 (H) 0 - 99 mg/dL Final   HDL  Date Value Ref Range Status  05/08/2023 61 >39 mg/dL Final   Triglycerides  Date Value Ref Range Status  05/08/2023 130 0 - 149 mg/dL Final         Passed - Patient is not pregnant      Passed - Valid encounter within last 12 months    Recent Outpatient Visits           2 months ago Primary hypertension   [place] [facility] Celestia Rosaline SQUIBB, NP   6 months ago Mixed hyperlipidemia   [place] [facility] Celestia Rosaline SQUIBB, NP   1 year ago Medication refill   [place] [facility] Celestia Rosaline SQUIBB, NP   2 years ago Medication refill   [place] [facility] Celestia Rosaline SQUIBB, NP   2 years ago Prediabetes   [place] [facility] Celestia Rosaline SQUIBB, NP

## 2023-11-16 ENCOUNTER — Other Ambulatory Visit: Payer: Self-pay

## 2023-11-17 ENCOUNTER — Telehealth: Payer: Self-pay | Admitting: Primary Care

## 2023-11-17 NOTE — Telephone Encounter (Signed)
 1st attempt: Called patient to reschedule appointment for 9/25 as provider will not be in the office that day. Left Voicemail for patient to call back.

## 2023-11-21 ENCOUNTER — Other Ambulatory Visit: Payer: Self-pay

## 2023-11-21 NOTE — Telephone Encounter (Signed)
 Patient appointment scheduled for 12/19/2023 at 9:10 am.

## 2023-11-23 ENCOUNTER — Ambulatory Visit (INDEPENDENT_AMBULATORY_CARE_PROVIDER_SITE_OTHER): Admitting: Primary Care

## 2023-12-05 ENCOUNTER — Other Ambulatory Visit: Payer: Self-pay

## 2023-12-18 ENCOUNTER — Telehealth (INDEPENDENT_AMBULATORY_CARE_PROVIDER_SITE_OTHER): Payer: Self-pay | Admitting: Primary Care

## 2023-12-18 NOTE — Telephone Encounter (Signed)
 Called pt to reschedule appt. Pt did not answer but LVM about pt calling back to reschedule appt.

## 2023-12-19 ENCOUNTER — Ambulatory Visit (INDEPENDENT_AMBULATORY_CARE_PROVIDER_SITE_OTHER): Admitting: Primary Care

## 2024-01-15 ENCOUNTER — Telehealth (INDEPENDENT_AMBULATORY_CARE_PROVIDER_SITE_OTHER): Payer: Self-pay | Admitting: Primary Care

## 2024-01-15 NOTE — Telephone Encounter (Signed)
 Called pt to confirm appt. Pt did not answer and LVM for pt about upcoming appt.

## 2024-01-16 ENCOUNTER — Other Ambulatory Visit (HOSPITAL_COMMUNITY)
Admission: RE | Admit: 2024-01-16 | Discharge: 2024-01-16 | Disposition: A | Discharge status: Home / Self Care | Payer: Self-pay | Source: Ambulatory Visit | Attending: Primary Care | Admitting: Primary Care

## 2024-01-16 ENCOUNTER — Encounter (INDEPENDENT_AMBULATORY_CARE_PROVIDER_SITE_OTHER): Payer: Self-pay | Admitting: Primary Care

## 2024-01-16 ENCOUNTER — Ambulatory Visit (INDEPENDENT_AMBULATORY_CARE_PROVIDER_SITE_OTHER): Payer: Self-pay | Admitting: Primary Care

## 2024-01-16 VITALS — BP 124/80 | HR 68 | Resp 16 | Wt 189.4 lb

## 2024-01-16 DIAGNOSIS — Z113 Encounter for screening for infections with a predominantly sexual mode of transmission: Secondary | ICD-10-CM

## 2024-01-16 DIAGNOSIS — E782 Mixed hyperlipidemia: Secondary | ICD-10-CM

## 2024-01-16 DIAGNOSIS — Z1211 Encounter for screening for malignant neoplasm of colon: Secondary | ICD-10-CM

## 2024-01-16 DIAGNOSIS — I10 Essential (primary) hypertension: Secondary | ICD-10-CM

## 2024-01-16 NOTE — Progress Notes (Signed)
 [facility]   Thomas Mclean. is a 43 y.o. male presents for hypertension evaluation, Denies shortness of breath, headaches, chest pain or lower extremity edema, sudden onset, vision changes, unilateral weakness, dizziness, paresthesias   Patient reports adherence with medications.  Dietary habits include: Monitoring sodium intake. Exercise habits include:walking Family / Social history: Paternal grandfather had MI- 57's   Past Medical History:  Diagnosis Date   Hypercholesteremia    Hypertension 2014   No past surgical history on file. No Known Allergies Current Outpatient Medications on File Prior to Visit  Medication Sig Dispense Refill   amLODipine  (NORVASC ) 10 MG tablet Take 1 tablet (10 mg total) by mouth daily. 90 tablet 1   atorvastatin  (LIPITOR) 40 MG tablet Take 1 tablet (40 mg total) by mouth daily. 90 tablet 1   hydrochlorothiazide  (HYDRODIURIL ) 25 MG tablet Take 1 tablet (25 mg total) by mouth every morning. 90 tablet 1   losartan  (COZAAR ) 50 MG tablet Take 1 tablet (50 mg total) by mouth daily. 90 tablet 1   No current facility-administered medications on file prior to visit.   Social History   Socioeconomic History   Marital status: Single    Spouse name: Not on file   Number of children: Not on file   Years of education: Not on file   Highest education level: GED or equivalent  Occupational History   Not on file  Tobacco Use   Smoking status: Every Day    Current packs/day: 0.50    Average packs/day: 0.5 packs/day for 30.9 years (15.4 ttl pk-yrs)    Types: Cigarettes    Start date: 02/28/1993   Smokeless tobacco: Never  Substance and Sexual Activity   Alcohol use: No    Alcohol/week: 0.0 standard drinks of alcohol    Comment: EOD   Drug use: No   Sexual activity: Yes  Other Topics Concern   Not on file  Social History Narrative   Not on file   Social Drivers of Health   Financial Resource Strain: Medium Risk (08/10/2023)    Overall Financial Resource Strain (CARDIA)    Difficulty of Paying Living Expenses: Somewhat hard  Food Insecurity: No Food Insecurity (08/10/2023)   Hunger Vital Sign    Worried About Running Out of Food in the Last Year: Never true    Ran Out of Food in the Last Year: Never true  Transportation Needs: Unmet Transportation Needs (08/10/2023)   PRAPARE - Transportation    Lack of Transportation (Medical): No    Lack of Transportation (Non-Medical): Yes  Physical Activity: Sufficiently Active (08/10/2023)   Exercise Vital Sign    Days of Exercise per Week: 7 days    Minutes of Exercise per Session: 130 min  Stress: No Stress Concern Present (08/10/2023)   [employer] of Occupational Health - Occupational Stress Questionnaire    Feeling of Stress: Not at all  Social Connections: Socially Isolated (08/10/2023)   Social Connection and Isolation Panel    Frequency of Communication with Friends and Family: More than three times a week    Frequency of Social Gatherings with Friends and Family: Once a week    Attends Religious Services: Never    Database Administrator or Organizations: No    Attends Banker Meetings: Not on file    Marital Status: Divorced  Intimate Partner Violence: Unknown (06/04/2021)   Received from [facility]   HITS    Physically Hurt: Not on file  Insult or Talk Down To: Not on file    Threaten Physical Harm: Not on file    Scream or Curse: Not on file   Family History  Problem Relation Age of Onset   Cancer Father    Hypertension Maternal Grandmother    Hypertension Maternal Grandfather    Health Maintenance  Topic Date Due   DTaP/Tdap/Td (1 - Tdap) Never done   Hepatitis B Vaccines 19-59 Average Risk (1 of 3 - 19+ 3-dose series) Never done   Colonoscopy  Never done   COVID-19 Vaccine (1 - 2025-26 season) Never done   Influenza Vaccine  05/28/2024 (Originally 09/29/2023)   Pneumococcal Vaccine (1 of 2 - PCV) 08/21/2024 (Originally  08/30/1997)   HPV VACCINES (1 - 3-dose SCDM series) 08/21/2024 (Originally 08/30/2005)   Hepatitis C Screening  Completed   HIV Screening  Completed   Meningococcal B Vaccine  Aged Out     OBJECTIVE:  Vitals:   01/16/24 1038  BP: 124/80  Pulse: 68  Resp: 16  SpO2: 98%  Weight: 189 lb 6.4 oz (85.9 kg)    Physical Exam Vitals reviewed.  Constitutional:      Appearance: He is obese.  HENT:     Head: Normocephalic.     Right Ear: Tympanic membrane, ear canal and external ear normal.     Left Ear: Tympanic membrane, ear canal and external ear normal.     Nose: Nose normal.  Eyes:     Extraocular Movements: Extraocular movements intact.     Pupils: Pupils are equal, round, and reactive to light.  Cardiovascular:     Rate and Rhythm: Normal rate and regular rhythm.  Pulmonary:     Effort: Pulmonary effort is normal.     Breath sounds: Normal breath sounds.  Abdominal:     General: Bowel sounds are normal. There is distension.     Palpations: Abdomen is soft.  Musculoskeletal:        General: Normal range of motion.     Cervical back: Normal range of motion.  Skin:    General: Skin is warm and dry.  Neurological:     Mental Status: He is oriented to person, place, and time.  Psychiatric:        Mood and Affect: Mood normal.        Behavior: Behavior normal.        Thought Content: Thought content normal.        Judgment: Judgment normal.      ROS  Last 3 Office BP readings: BP Readings from Last 3 Encounters:  01/16/24 124/80  04/26/23 123/72  08/18/22 (!) 148/86    BMET    Component Value Date/Time   NA 138 05/08/2023 0853   K 4.2 05/08/2023 0853   CL 100 05/08/2023 0853   CO2 23 05/08/2023 0853   GLUCOSE 93 05/08/2023 0853   GLUCOSE 113 (H) 12/02/2018 0846   BUN 13 05/08/2023 0853   CREATININE 0.93 05/08/2023 0853   CALCIUM  9.6 05/08/2023 0853   GFRNONAA >60 12/02/2018 0846   GFRAA >60 12/02/2018 0846    Renal function: CrCl cannot be calculated  (Patient's most recent lab result is older than the maximum 21 days allowed.).  Clinical ASCVD: No  The 10-year ASCVD risk score (Arnett DK, et al., 2019) is: 4.8%   Values used to calculate the score:     Age: 31 years     Clincally relevant sex: Male     Is Non-Hispanic African  American: No     Diabetic: No     Tobacco smoker: Yes     Systolic Blood Pressure: 124 mmHg     Is BP treated: Yes     HDL Cholesterol: 61 mg/dL     Total Cholesterol: 203 mg/dL  ASCVD risk factors include- CHAD   ASSESSMENT & PLAN: [place] was seen today for hypertension.  Diagnoses and all orders for this visit:  Colon cancer screening -     Fecal occult blood, imunochemical; Future   Mixed hyperlipidemia -     Lipid panel  Screen for STD (sexually transmitted disease) -     Urine cytology ancillary only  Primary hypertension- Well controlled      -Counseled on lifestyle modifications for blood pressure control including reduced dietary sodium, increased exercise, weight reduction and adequate sleep. Also, educated patient about the risk for cardiovascular events, stroke and heart attack. Also counseled patient about the importance of medication adherence. If you participate in smoking, it is important to stop using tobacco as this will increase the risks associated with uncontrolled blood pressure.    CMP14+EGFR   Goal BP:  For patients younger than 60: Goal BP < 130/80. For patients 60 and older: Goal BP < 140/90. For patients with diabetes: Goal BP < 130/80. Your most recent BP: 124/80  Minimize salt intake. Minimize alcohol intake    This note has been created with Education officer, environmental. Any transcriptional errors are unintentional.   Rosaline SHAUNNA Bohr, NP 01/16/2024, 11:15 AM

## 2024-01-17 LAB — LIPID PANEL
Chol/HDL Ratio: 2.3 ratio (ref 0.0–5.0)
Cholesterol, Total: 127 mg/dL (ref 100–199)
HDL: 55 mg/dL (ref >39)
LDL Chol Calc (NIH): 54 mg/dL (ref 0–99)
Triglycerides: 96 mg/dL (ref 0–149)
VLDL Cholesterol Cal: 18 mg/dL (ref 5–40)

## 2024-01-17 LAB — CMP14+EGFR
ALT: 10 IU/L (ref 0–44)
AST: 17 IU/L (ref 0–40)
Albumin: 4.2 g/dL (ref 4.1–5.1)
Alkaline Phosphatase: 56 IU/L (ref 47–123)
BUN/Creatinine Ratio: 13 (ref 9–20)
BUN: 9 mg/dL (ref 6–24)
Bilirubin Total: 0.4 mg/dL (ref 0.0–1.2)
CO2: 23 mmol/L (ref 20–29)
Calcium: 9.5 mg/dL (ref 8.7–10.2)
Chloride: 99 mmol/L (ref 96–106)
Creatinine, Ser: 0.72 mg/dL — ABNORMAL LOW (ref 0.76–1.27)
Globulin, Total: 1.9 g/dL (ref 1.5–4.5)
Glucose: 97 mg/dL (ref 70–99)
Potassium: 3.8 mmol/L (ref 3.5–5.2)
Sodium: 138 mmol/L (ref 134–144)
Total Protein: 6.1 g/dL (ref 6.0–8.5)
eGFR: 115 mL/min/1.73 (ref >59)

## 2024-01-19 LAB — URINE CYTOLOGY ANCILLARY ONLY
Chlamydia: NEGATIVE
Comment: NEGATIVE
Comment: NEGATIVE
Comment: NORMAL
Neisseria Gonorrhea: NEGATIVE
Trichomonas: NEGATIVE

## 2024-01-22 ENCOUNTER — Ambulatory Visit (INDEPENDENT_AMBULATORY_CARE_PROVIDER_SITE_OTHER): Payer: Self-pay | Admitting: Primary Care

## 2024-04-02 ENCOUNTER — Other Ambulatory Visit: Payer: Self-pay
